# Patient Record
Sex: Female | Born: 1975 | Race: Black or African American | Hispanic: No | Marital: Single | Smoking: Current every day smoker
Health system: Southern US, Community
[De-identification: ages and names within clinical notes are randomized; demographics above are authoritative.]

## PROBLEM LIST (undated history)

## (undated) ENCOUNTER — Inpatient Hospital Stay (HOSPITAL_COMMUNITY): Payer: Self-pay

## (undated) DIAGNOSIS — F329 Major depressive disorder, single episode, unspecified: Secondary | ICD-10-CM

## (undated) DIAGNOSIS — Z789 Other specified health status: Secondary | ICD-10-CM

## (undated) DIAGNOSIS — F419 Anxiety disorder, unspecified: Secondary | ICD-10-CM

## (undated) DIAGNOSIS — F32A Depression, unspecified: Secondary | ICD-10-CM

## (undated) HISTORY — PX: OVARIAN CYST SURGERY: SHX726

---
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
## 1997-05-18 ENCOUNTER — Ambulatory Visit (HOSPITAL_COMMUNITY): Admission: RE | Admit: 1997-05-18 | Discharge: 1997-05-18 | Payer: Self-pay | Admitting: Obstetrics

## 1997-07-06 ENCOUNTER — Inpatient Hospital Stay (HOSPITAL_COMMUNITY): Admission: AD | Admit: 1997-07-06 | Discharge: 1997-07-06 | Payer: Self-pay | Admitting: Obstetrics

## 1997-07-07 ENCOUNTER — Encounter: Admission: RE | Admit: 1997-07-07 | Discharge: 1997-10-05 | Payer: Self-pay | Admitting: Obstetrics

## 1997-08-10 ENCOUNTER — Ambulatory Visit (HOSPITAL_COMMUNITY): Admission: RE | Admit: 1997-08-10 | Discharge: 1997-08-10 | Payer: Self-pay | Admitting: Obstetrics & Gynecology

## 1997-09-13 ENCOUNTER — Inpatient Hospital Stay (HOSPITAL_COMMUNITY): Admission: AD | Admit: 1997-09-13 | Discharge: 1997-09-13 | Payer: Self-pay | Admitting: *Deleted

## 1997-09-14 ENCOUNTER — Inpatient Hospital Stay (HOSPITAL_COMMUNITY): Admission: AD | Admit: 1997-09-14 | Discharge: 1997-09-14 | Payer: Self-pay | Admitting: Obstetrics

## 1997-09-19 ENCOUNTER — Ambulatory Visit (HOSPITAL_COMMUNITY): Admission: RE | Admit: 1997-09-19 | Discharge: 1997-09-19 | Payer: Self-pay | Admitting: Obstetrics

## 1997-10-11 ENCOUNTER — Inpatient Hospital Stay (HOSPITAL_COMMUNITY): Admission: AD | Admit: 1997-10-11 | Discharge: 1997-10-11 | Payer: Self-pay | Admitting: *Deleted

## 1997-10-20 ENCOUNTER — Encounter: Admission: RE | Admit: 1997-10-20 | Discharge: 1998-01-18 | Payer: Self-pay | Admitting: Obstetrics

## 1997-11-17 ENCOUNTER — Inpatient Hospital Stay (HOSPITAL_COMMUNITY): Admission: AD | Admit: 1997-11-17 | Discharge: 1997-11-18 | Payer: Self-pay | Admitting: Obstetrics

## 1998-02-27 ENCOUNTER — Encounter: Payer: Self-pay | Admitting: Emergency Medicine

## 1998-02-27 ENCOUNTER — Emergency Department (HOSPITAL_COMMUNITY): Admission: EM | Admit: 1998-02-27 | Discharge: 1998-02-27 | Payer: Self-pay | Admitting: *Deleted

## 1998-10-22 ENCOUNTER — Emergency Department (HOSPITAL_COMMUNITY): Admission: EM | Admit: 1998-10-22 | Discharge: 1998-10-22 | Payer: Self-pay | Admitting: Emergency Medicine

## 1999-04-17 ENCOUNTER — Inpatient Hospital Stay (HOSPITAL_COMMUNITY): Admission: AD | Admit: 1999-04-17 | Discharge: 1999-04-17 | Payer: Self-pay | Admitting: *Deleted

## 1999-04-26 ENCOUNTER — Encounter: Admission: RE | Admit: 1999-04-26 | Discharge: 1999-04-26 | Payer: Self-pay | Admitting: Obstetrics

## 1999-05-01 ENCOUNTER — Ambulatory Visit (HOSPITAL_COMMUNITY): Admission: RE | Admit: 1999-05-01 | Discharge: 1999-05-01 | Payer: Self-pay | Admitting: Obstetrics

## 1999-05-17 ENCOUNTER — Encounter: Admission: RE | Admit: 1999-05-17 | Discharge: 1999-05-17 | Payer: Self-pay | Admitting: Obstetrics

## 1999-07-11 ENCOUNTER — Inpatient Hospital Stay (HOSPITAL_COMMUNITY): Admission: AD | Admit: 1999-07-11 | Discharge: 1999-07-11 | Payer: Self-pay | Admitting: Obstetrics

## 1999-07-30 ENCOUNTER — Inpatient Hospital Stay (HOSPITAL_COMMUNITY): Admission: AD | Admit: 1999-07-30 | Discharge: 1999-08-01 | Payer: Self-pay | Admitting: Obstetrics

## 1999-08-03 ENCOUNTER — Emergency Department (HOSPITAL_COMMUNITY): Admission: EM | Admit: 1999-08-03 | Discharge: 1999-08-03 | Payer: Self-pay | Admitting: Emergency Medicine

## 2000-08-17 ENCOUNTER — Emergency Department (HOSPITAL_COMMUNITY): Admission: EM | Admit: 2000-08-17 | Discharge: 2000-08-17 | Payer: Self-pay | Admitting: Emergency Medicine

## 2001-03-19 ENCOUNTER — Encounter: Payer: Self-pay | Admitting: Emergency Medicine

## 2001-03-19 ENCOUNTER — Emergency Department (HOSPITAL_COMMUNITY): Admission: EM | Admit: 2001-03-19 | Discharge: 2001-03-19 | Payer: Self-pay

## 2001-06-11 ENCOUNTER — Other Ambulatory Visit: Admission: RE | Admit: 2001-06-11 | Discharge: 2001-06-11 | Payer: Self-pay | Admitting: *Deleted

## 2001-07-03 ENCOUNTER — Encounter: Payer: Self-pay | Admitting: *Deleted

## 2001-07-03 ENCOUNTER — Ambulatory Visit (HOSPITAL_COMMUNITY): Admission: RE | Admit: 2001-07-03 | Discharge: 2001-07-03 | Payer: Self-pay | Admitting: *Deleted

## 2001-08-28 ENCOUNTER — Inpatient Hospital Stay (HOSPITAL_COMMUNITY): Admission: AD | Admit: 2001-08-28 | Discharge: 2001-08-29 | Payer: Self-pay | Admitting: *Deleted

## 2001-11-01 ENCOUNTER — Emergency Department (HOSPITAL_COMMUNITY): Admission: EM | Admit: 2001-11-01 | Discharge: 2001-11-01 | Payer: Self-pay | Admitting: *Deleted

## 2001-11-09 ENCOUNTER — Emergency Department (HOSPITAL_COMMUNITY): Admission: EM | Admit: 2001-11-09 | Discharge: 2001-11-09 | Payer: Self-pay | Admitting: Emergency Medicine

## 2001-11-25 ENCOUNTER — Emergency Department (HOSPITAL_COMMUNITY): Admission: EM | Admit: 2001-11-25 | Discharge: 2001-11-26 | Payer: Self-pay | Admitting: Emergency Medicine

## 2002-11-27 ENCOUNTER — Encounter: Payer: Self-pay | Admitting: Obstetrics & Gynecology

## 2002-11-27 ENCOUNTER — Inpatient Hospital Stay (HOSPITAL_COMMUNITY): Admission: AD | Admit: 2002-11-27 | Discharge: 2002-11-27 | Payer: Self-pay | Admitting: Obstetrics & Gynecology

## 2003-03-22 ENCOUNTER — Encounter: Admission: RE | Admit: 2003-03-22 | Discharge: 2003-03-22 | Payer: Self-pay | Admitting: *Deleted

## 2003-03-25 ENCOUNTER — Inpatient Hospital Stay (HOSPITAL_COMMUNITY): Admission: AD | Admit: 2003-03-25 | Discharge: 2003-03-27 | Payer: Self-pay | Admitting: *Deleted

## 2003-06-11 ENCOUNTER — Emergency Department (HOSPITAL_COMMUNITY): Admission: EM | Admit: 2003-06-11 | Discharge: 2003-06-11 | Payer: Self-pay | Admitting: Emergency Medicine

## 2003-06-14 ENCOUNTER — Emergency Department (HOSPITAL_COMMUNITY): Admission: AD | Admit: 2003-06-14 | Discharge: 2003-06-14 | Payer: Self-pay | Admitting: Family Medicine

## 2004-01-10 ENCOUNTER — Emergency Department (HOSPITAL_COMMUNITY): Admission: EM | Admit: 2004-01-10 | Discharge: 2004-01-10 | Payer: Self-pay | Admitting: Emergency Medicine

## 2004-01-18 ENCOUNTER — Emergency Department (HOSPITAL_COMMUNITY): Admission: EM | Admit: 2004-01-18 | Discharge: 2004-01-18 | Payer: Self-pay | Admitting: Emergency Medicine

## 2004-08-06 ENCOUNTER — Emergency Department (HOSPITAL_COMMUNITY): Admission: EM | Admit: 2004-08-06 | Discharge: 2004-08-06 | Payer: Self-pay | Admitting: Emergency Medicine

## 2004-08-17 ENCOUNTER — Emergency Department (HOSPITAL_COMMUNITY): Admission: EM | Admit: 2004-08-17 | Discharge: 2004-08-17 | Payer: Self-pay | Admitting: Emergency Medicine

## 2004-10-06 ENCOUNTER — Emergency Department (HOSPITAL_COMMUNITY): Admission: EM | Admit: 2004-10-06 | Discharge: 2004-10-06 | Payer: Self-pay | Admitting: Emergency Medicine

## 2005-01-17 ENCOUNTER — Inpatient Hospital Stay (HOSPITAL_COMMUNITY): Admission: AD | Admit: 2005-01-17 | Discharge: 2005-01-17 | Payer: Self-pay | Admitting: *Deleted

## 2005-02-18 ENCOUNTER — Inpatient Hospital Stay (HOSPITAL_COMMUNITY): Admission: AD | Admit: 2005-02-18 | Discharge: 2005-02-20 | Payer: Self-pay | Admitting: Obstetrics

## 2005-02-18 ENCOUNTER — Encounter (INDEPENDENT_AMBULATORY_CARE_PROVIDER_SITE_OTHER): Payer: Self-pay | Admitting: *Deleted

## 2005-02-19 ENCOUNTER — Encounter (INDEPENDENT_AMBULATORY_CARE_PROVIDER_SITE_OTHER): Payer: Self-pay | Admitting: *Deleted

## 2005-08-14 ENCOUNTER — Emergency Department (HOSPITAL_COMMUNITY): Admission: EM | Admit: 2005-08-14 | Discharge: 2005-08-14 | Payer: Self-pay | Admitting: Family Medicine

## 2005-11-18 ENCOUNTER — Inpatient Hospital Stay (HOSPITAL_COMMUNITY): Admission: AD | Admit: 2005-11-18 | Discharge: 2005-11-19 | Payer: Self-pay | Admitting: Gynecology

## 2005-11-18 ENCOUNTER — Encounter: Payer: Self-pay | Admitting: Emergency Medicine

## 2005-11-18 ENCOUNTER — Encounter (INDEPENDENT_AMBULATORY_CARE_PROVIDER_SITE_OTHER): Payer: Self-pay | Admitting: *Deleted

## 2005-11-21 ENCOUNTER — Inpatient Hospital Stay (HOSPITAL_COMMUNITY): Admission: AD | Admit: 2005-11-21 | Discharge: 2005-11-21 | Payer: Self-pay | Admitting: Obstetrics

## 2006-07-13 ENCOUNTER — Inpatient Hospital Stay (HOSPITAL_COMMUNITY): Admission: AD | Admit: 2006-07-13 | Discharge: 2006-07-13 | Payer: Self-pay | Admitting: Obstetrics

## 2007-03-02 ENCOUNTER — Emergency Department (HOSPITAL_COMMUNITY): Admission: EM | Admit: 2007-03-02 | Discharge: 2007-03-02 | Payer: Self-pay | Admitting: Emergency Medicine

## 2007-05-19 ENCOUNTER — Inpatient Hospital Stay (HOSPITAL_COMMUNITY): Admission: AD | Admit: 2007-05-19 | Discharge: 2007-05-19 | Payer: Self-pay | Admitting: Obstetrics & Gynecology

## 2007-05-20 ENCOUNTER — Inpatient Hospital Stay (HOSPITAL_COMMUNITY): Admission: AD | Admit: 2007-05-20 | Discharge: 2007-05-20 | Payer: Self-pay | Admitting: Gynecology

## 2007-05-23 ENCOUNTER — Inpatient Hospital Stay (HOSPITAL_COMMUNITY): Admission: AD | Admit: 2007-05-23 | Discharge: 2007-05-23 | Payer: Self-pay | Admitting: Obstetrics and Gynecology

## 2007-05-26 ENCOUNTER — Inpatient Hospital Stay (HOSPITAL_COMMUNITY): Admission: AD | Admit: 2007-05-26 | Discharge: 2007-05-26 | Payer: Self-pay | Admitting: Obstetrics

## 2007-05-27 ENCOUNTER — Inpatient Hospital Stay (HOSPITAL_COMMUNITY): Admission: AD | Admit: 2007-05-27 | Discharge: 2007-05-27 | Payer: Self-pay | Admitting: Obstetrics

## 2007-05-30 ENCOUNTER — Inpatient Hospital Stay (HOSPITAL_COMMUNITY): Admission: AD | Admit: 2007-05-30 | Discharge: 2007-05-30 | Payer: Self-pay | Admitting: Obstetrics

## 2007-06-03 ENCOUNTER — Inpatient Hospital Stay (HOSPITAL_COMMUNITY): Admission: AD | Admit: 2007-06-03 | Discharge: 2007-06-03 | Payer: Self-pay | Admitting: Obstetrics

## 2007-09-29 ENCOUNTER — Emergency Department (HOSPITAL_COMMUNITY): Admission: EM | Admit: 2007-09-29 | Discharge: 2007-09-29 | Payer: Self-pay | Admitting: Emergency Medicine

## 2007-10-15 ENCOUNTER — Emergency Department (HOSPITAL_COMMUNITY): Admission: EM | Admit: 2007-10-15 | Discharge: 2007-10-15 | Payer: Self-pay | Admitting: Emergency Medicine

## 2008-01-29 ENCOUNTER — Emergency Department (HOSPITAL_COMMUNITY): Admission: EM | Admit: 2008-01-29 | Discharge: 2008-01-29 | Payer: Self-pay | Admitting: Emergency Medicine

## 2008-03-15 ENCOUNTER — Emergency Department (HOSPITAL_COMMUNITY): Admission: EM | Admit: 2008-03-15 | Discharge: 2008-03-15 | Payer: Self-pay | Admitting: Emergency Medicine

## 2008-11-30 ENCOUNTER — Emergency Department (HOSPITAL_COMMUNITY): Admission: EM | Admit: 2008-11-30 | Discharge: 2008-11-30 | Payer: Self-pay | Admitting: Emergency Medicine

## 2009-05-19 ENCOUNTER — Emergency Department (HOSPITAL_COMMUNITY): Admission: EM | Admit: 2009-05-19 | Discharge: 2009-05-19 | Payer: Self-pay | Admitting: Emergency Medicine

## 2009-12-05 ENCOUNTER — Inpatient Hospital Stay (HOSPITAL_COMMUNITY): Admission: AD | Admit: 2009-12-05 | Discharge: 2009-12-06 | Payer: Self-pay | Admitting: Obstetrics & Gynecology

## 2010-06-22 LAB — URINALYSIS, ROUTINE W REFLEX MICROSCOPIC
Hgb urine dipstick: NEGATIVE
Nitrite: NEGATIVE
Specific Gravity, Urine: 1.025 (ref 1.005–1.030)
Urobilinogen, UA: 1 mg/dL (ref 0.0–1.0)

## 2010-06-22 LAB — URINE MICROSCOPIC-ADD ON

## 2010-06-22 LAB — POCT PREGNANCY, URINE: Preg Test, Ur: NEGATIVE

## 2010-08-20 ENCOUNTER — Emergency Department (HOSPITAL_COMMUNITY)
Admission: EM | Admit: 2010-08-20 | Discharge: 2010-08-20 | Disposition: A | Discharge status: Home / Self Care | Payer: Self-pay | Attending: Emergency Medicine | Admitting: Emergency Medicine

## 2010-08-20 DIAGNOSIS — K029 Dental caries, unspecified: Secondary | ICD-10-CM | POA: Insufficient documentation

## 2010-08-20 DIAGNOSIS — K089 Disorder of teeth and supporting structures, unspecified: Secondary | ICD-10-CM | POA: Insufficient documentation

## 2010-08-24 NOTE — Op Note (Signed)
NAME:  Vanessa Walsh, REINECKE                 ACCOUNT NO.:  [id number]   MEDICAL RECORD NO.:  [id number]          PATIENT TYPE:  INP   LOCATION:                                [facility]   PHYSICIAN:  Kathreen Cosier, M.D.DATE OF BIRTH:  [birth year]   DATE OF PROCEDURE:  11/18/2005  DATE OF DISCHARGE:                                 OPERATIVE REPORT   PREOPERATIVE DIAGNOSIS:  Ruptured ectopic pregnancy.   POSTOPERATIVE DIAGNOSES:  1. Ruptured hemorrhagic corpus luteum cyst.  2. Possible tubal abortion.   SURGEON:  Kathreen Cosier, M.D.   PROCEDURE:  1. Wedge resection of the left ovary.  2. Partial salpingectomy.   DESCRIPTION OF PROCEDURE:  Under general anesthesia with the patient in the  supine position the abdomen was prepped and draped and bladder emptied with  the Foley catheter.  A transverse suprapubic incision 2 inches long was made  and carried down to the fascia.  The fascia __________ incision and recti  muscles were retracted laterally.  The peritoneum was incised  longitudinally.  About 200 mL of free blood and clots in her peritoneal  cavity.  Uterus normal, right ovary normal.  Right tube appeared normal  except for where she had a tubal ligation.  Left tube--she had a tubal  ligation and the ampullary portion appeared hemorrhagic.  She was leaking  blood from her left ovary.  So, using the scalpel, a wedge resection was  done on the left and hemostasis was achieved with interrupted sutures of #0  Prolene.  Kelly's were placed across the ampullary portion of the left tube  and the portion of the tube was removed.   Hemostasis was achieved with an interrupted suture of #0 chromic. Hemostasis  was satisfactory and lap and sponge counts were correct.  Peritoneal cavity  irrigated.  The abdomen was closed in layers, peritoneum with continuous  suture of #0 chromic, fascia with continuous suture of #0 Dexon and the skin  closed with subcuticular stitch of 3-0  Monocryl.   POSTOPERATIVE DIAGNOSES:  1. Ruptured hemorrhagic left post luteal cyst.  2. Possible tubal abortion.           ______________________________  Kathreen Cosier, M.D.     BAM/MEDQ  D:  11/18/2005  T:  11/18/2005  Job:  [id number]

## 2010-08-24 NOTE — Discharge Summary (Signed)
NAME:  Vanessa Walsh, Vanessa Walsh                 ACCOUNT NO.:  [id number]   MEDICAL RECORD NO.:  [id number]          PATIENT TYPE:  INP   LOCATION:  9318                          [facility]  [facility]   PHYSICIAN:  Kathreen Cosier, M.D.DATE OF BIRTH:  [birth year]   DATE OF ADMISSION:  11/18/2005  DATE OF DISCHARGE:  11/19/2005                                 DISCHARGE SUMMARY   Patient is a 35 year old gravida 10, para 9, 0-0-9, with a postpartum tubal  ligation.  She was admitted with cramping, positive pregnancy test.   Ultrasound showed a complex left adnexal mass with free fluid in the  abdomen.   Patient was taken to the operating room with a diagnosis of erupted ectopic  pregnancy.  It was found that she had an erupted corpus luteum cyst,  actively bleeding, a tubal abortion.  She underwent resection on the left  ovary and a left salpingectomy performed.  The right ovary was normal.  The  right tube was status post tubal ligation.  The patient was discharged on  August 14 on Tylox for pain to see me in two weeks.   DISCHARGE DIAGNOSIS:  Status post erupted hemorrhagic corpus luteum cyst and  tubal abortion.           ______________________________  Kathreen Cosier, M.D.     BAM/MEDQ  D:  12/11/2005  T:  12/11/2005  Job:  [id number]

## 2010-08-24 NOTE — Discharge Summary (Signed)
NAME:  Vanessa Walsh, Vanessa Walsh                 ACCOUNT NO.:  [id number]   MEDICAL RECORD NO.:  [id number]          PATIENT TYPE:  INP   LOCATION:  9111                          [facility]  [facility]   PHYSICIAN:  Kathreen Cosier, M.D.DATE OF BIRTH:  12/09/1975   DATE OF ADMISSION:  02/18/2005  DATE OF DISCHARGE:  02/20/2005                                 DISCHARGE SUMMARY   Patient is a 35 year old gravida 30, para 6-2-0-8, [facility] March 03, 2005  admitted in labor.  Had a normal vaginal delivery at term.  Patient desired  sterilization.  On admission her hemoglobin is 9.8, white count 24,  platelets 258, RPR negative, HIV negative.  Post delivery hemoglobin 8.3,  white count 15.8.  On November 14 the patient underwent postpartum tubal  ligation.  Postoperative she did fine.  Was discharged on her second  postpartum day, ambulatory, on a regular diet.  Hemoglobin 8.3.  Patient was  discharged on Tylenol No.3 for pain.   DISCHARGE DIAGNOSES:  Status post normal vaginal delivery at term,  postpartum tubal ligation.           ______________________________  Kathreen Cosier, M.D.     BAM/MEDQ  D:  03/13/2005  T:  03/13/2005  Job:  [id number]

## 2010-08-24 NOTE — Op Note (Signed)
NAME:  Vanessa Walsh, Vanessa Walsh                 ACCOUNT NO.:  [id number]   MEDICAL RECORD NO.:  [id number]          PATIENT TYPE:  INP   LOCATION:  9111                          [facility]  [facility]   PHYSICIAN:  Kathreen Cosier, M.D.DATE OF BIRTH:  05-Jan-1976   DATE OF PROCEDURE:  02/19/2005  DATE OF DISCHARGE:                                 OPERATIVE REPORT   PROCEDURE:  Postpartum tubal ligation.   ANESTHESIA:  Epidural.   DESCRIPTION OF PROCEDURE:  With the patient in the supine position, the  abdomen was prepped and draped.  The bladder was emptied with a straight  catheter.  Midline subumbilical incision 1 inch long was made and carried  down to the fascia.  The fascia was cleaned and grasped with two Kochers.  The fascia and the peritoneum were opened with the Mayo scissors.  The left  tube was grasped in the midportion with the Babcock clamp and the tube  traced to the fimbria.  0 plain suture was placed in the mesosalpinx below  the portion of the tube within the clamp.  This was tied.  Approximately 1  inch of tube transected.  Hemostasis was satisfactory.  The procedure was  repeated on the other side in the exact fashion.  Needle, sponge, and  instrument counts correct.  Abdomen closed in layers.  The peritoneum and  fascia with continuous suture of 0 Dexon.  Skin closed with subcuticular  stitch of 4-0 Monocryl.  Estimated blood loss minimal.  The patient  tolerated the procedure well and was taken to the recovery room in good  condition.           ______________________________  Kathreen Cosier, M.D.     BAM/MEDQ  D:  02/19/2005  T:  02/19/2005  Job:  [id number]

## 2010-11-28 ENCOUNTER — Emergency Department (HOSPITAL_COMMUNITY): Payer: Medicaid Other

## 2010-11-28 ENCOUNTER — Emergency Department (HOSPITAL_COMMUNITY)
Admission: EM | Admit: 2010-11-28 | Discharge: 2010-11-28 | Disposition: A | Discharge status: Home / Self Care | Payer: Medicaid Other | Attending: Emergency Medicine | Admitting: Emergency Medicine

## 2010-11-28 DIAGNOSIS — M545 Low back pain, unspecified: Secondary | ICD-10-CM | POA: Insufficient documentation

## 2010-11-28 DIAGNOSIS — M25559 Pain in unspecified hip: Secondary | ICD-10-CM | POA: Insufficient documentation

## 2010-12-28 LAB — CREATININE, SERUM: GFR calc non Af Amer: >60

## 2010-12-28 LAB — DIFFERENTIAL
Basophils Absolute: 0
Basophils Relative: 0
Eosinophils Relative: 3
Monocytes Absolute: 0.9

## 2010-12-28 LAB — CBC
HCT: 35.9 — ABNORMAL LOW
Hemoglobin: 12.4
MCHC: 34.5
RDW: 13.9

## 2010-12-31 LAB — BUN: BUN: 6

## 2010-12-31 LAB — DIFFERENTIAL
Eosinophils Absolute: 0.2
Eosinophils Relative: 4
Lymphs Abs: 1.9
Monocytes Absolute: 0.3

## 2010-12-31 LAB — CBC
HCT: 35.7 — ABNORMAL LOW
Platelets: 211
RDW: 13.2

## 2010-12-31 LAB — HCG, QUANTITATIVE, PREGNANCY
hCG, Beta Chain, Quant, S: 305 — ABNORMAL HIGH
hCG, Beta Chain, Quant, S: 867 — ABNORMAL HIGH

## 2010-12-31 LAB — AST: AST: 13

## 2010-12-31 LAB — CREATININE, SERUM
Creatinine, Ser: 0.74
GFR calc non Af Amer: >60

## 2011-07-05 ENCOUNTER — Emergency Department (HOSPITAL_COMMUNITY)
Admission: EM | Admit: 2011-07-05 | Discharge: 2011-07-05 | Disposition: A | Discharge status: Home / Self Care | Payer: Medicaid Other | Attending: Emergency Medicine | Admitting: Emergency Medicine

## 2011-07-05 ENCOUNTER — Emergency Department (HOSPITAL_COMMUNITY): Payer: Medicaid Other

## 2011-07-05 ENCOUNTER — Encounter (HOSPITAL_COMMUNITY): Payer: Self-pay | Admitting: Emergency Medicine

## 2011-07-05 DIAGNOSIS — M79609 Pain in unspecified limb: Secondary | ICD-10-CM | POA: Insufficient documentation

## 2011-07-05 DIAGNOSIS — M7710 Lateral epicondylitis, unspecified elbow: Secondary | ICD-10-CM | POA: Insufficient documentation

## 2011-07-05 DIAGNOSIS — R29898 Other symptoms and signs involving the musculoskeletal system: Secondary | ICD-10-CM | POA: Insufficient documentation

## 2011-07-05 DIAGNOSIS — M25529 Pain in unspecified elbow: Secondary | ICD-10-CM | POA: Insufficient documentation

## 2011-07-05 LAB — DIFFERENTIAL
Basophils Absolute: 0 10*3/uL (ref 0.0–0.1)
Basophils Relative: 0 % (ref 0–1)
Lymphocytes Relative: 35 % (ref 12–46)
Monocytes Absolute: 0.7 10*3/uL (ref 0.1–1.0)
Neutro Abs: 5.4 10*3/uL (ref 1.7–7.7)
Neutrophils Relative %: 56 % (ref 43–77)

## 2011-07-05 LAB — SEDIMENTATION RATE: Sed Rate: 30 mm/hr — ABNORMAL HIGH (ref 0–22)

## 2011-07-05 LAB — D-DIMER, QUANTITATIVE: D-Dimer, Quant: 0.33 ug/mL-FEU (ref 0.00–0.48)

## 2011-07-05 LAB — CBC
HCT: 36.8 % (ref 36.0–46.0)
Platelets: 263 10*3/uL (ref 150–400)
RDW: 13.8 % (ref 11.5–15.5)
WBC: 9.5 10*3/uL (ref 4.0–10.5)

## 2011-07-05 MED ORDER — KETOROLAC TROMETHAMINE 30 MG/ML IJ SOLN
30.0000 mg | Freq: Once | INTRAMUSCULAR | Status: AC
  Administered 2011-07-05: 30 mg via INTRAMUSCULAR
  Filled 2011-07-05: qty 1

## 2011-07-05 MED ORDER — OXYCODONE-ACETAMINOPHEN 5-325 MG PO TABS
2.0000 | ORAL_TABLET | Freq: Once | ORAL | Status: DC
  Filled 2011-07-05: qty 2

## 2011-07-05 MED ORDER — NAPROXEN 500 MG PO TABS: 500.0000 mg | ORAL_TABLET | Freq: Two times a day (BID) | ORAL | Status: DC

## 2011-07-05 MED ORDER — HYDROCODONE-ACETAMINOPHEN 5-325 MG PO TABS: 1.0000 | ORAL_TABLET | ORAL | Status: AC | PRN

## 2011-07-05 NOTE — ED Provider Notes (Signed)
History     CSN: [id number]  Arrival date & time 07/05/11  0029   First MD Initiated Contact with Patient 07/05/11 0215      Chief Complaint  Patient presents with  . Arm Pain     HPI  History provided by the patient. Patient is a 36 year old female with no significant medical history who presents with complaints of right elbow and arm pain for the past several weeks. Pain is worse with certain movements and position. Pain is described as severe. Patient has not taken anything to treat her symptoms. She denies any other aggravating or alleviating factors. Pain is not associated with any swelling, skin changes, fever, chills or numbness. Patient does state that her arm feels weak since she has been unable to use it. Patient denies any history of trauma or injury to the arm. 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 She denies having similar symptoms previously.   History reviewed. No pertinent past medical history.  History reviewed. No pertinent past surgical history.  No family history on file.  History  Substance Use Topics  . Smoking status: Never Smoker   . Smokeless tobacco: Not on file  . Alcohol Use: No    OB History    Grav Para Term Preterm Abortions TAB SAB Ect Mult Living                  Review of Systems  Constitutional: Negative for fever and chills.  HENT: Negative for neck pain.   Musculoskeletal: Negative for joint swelling.  Neurological: Positive for weakness. Negative for numbness.    Allergies  Review of patient's allergies indicates no known allergies.  Home Medications  No current outpatient prescriptions on file.  BP 128/66  Pulse 83  Temp(Src) 98.1 F (36.7 C) (Oral)  Resp 18  SpO2 100%  LMP 06/04/2011  Physical Exam  Nursing note and vitals reviewed. Constitutional: She is oriented to person, place, and time. She appears well-developed and well-nourished. No distress.  HENT:    Head: Normocephalic and atraumatic.  Neck: Normal range of motion. Neck supple.       No cervical midline tenderness  Cardiovascular: Normal rate and regular rhythm.   Pulmonary/Chest: Effort normal and breath sounds normal.  Musculoskeletal: Normal range of motion. She exhibits tenderness. She exhibits no edema.       Tenderness over medial and lateral right elbow but greatest over lateral elbow and epicondyle. Tenderness extends into the extensor muscles of forearm. No deformity or swelling noted. Will range of motion of elbow, shoulder and right hand. Skin normal without erythema. No lesions to skin.  Neurological: She is alert and oriented to person, place, and time.  Skin: Skin is warm and dry. No rash noted.  Psychiatric: She has a normal mood and affect. Her behavior is normal.    ED Course  Procedures   Results for orders placed during the hospital encounter of 07/05/11  SEDIMENTATION RATE      Component Value Range   Sed Rate 30 (*) 0 - 22 (mm/hr)  CBC      Component Value Range   WBC 9.5  4.0 - 10.5 (K/uL)   RBC 3.78 (*) 3.87 - 5.11 (MIL/uL)   Hemoglobin 12.1  12.0 - 15.0 (g/dL)   HCT 40.9  81.1 - 91.4 (%)   MCV 97.4  78.0 - 100.0 (fL)   MCH 32.0  26.0 - 34.0 (pg)  MCHC 32.9  30.0 - 36.0 (g/dL)   RDW 82.9  56.2 - 13.0 (%)   Platelets 263  150 - 400 (K/uL)  DIFFERENTIAL      Component Value Range   Neutrophils Relative 56  43 - 77 (%)   Neutro Abs 5.4  1.7 - 7.7 (K/uL)   Lymphocytes Relative 35  12 - 46 (%)   Lymphs Abs 3.3  0.7 - 4.0 (K/uL)   Monocytes Relative 7  3 - 12 (%)   Monocytes Absolute 0.7  0.1 - 1.0 (K/uL)   Eosinophils Relative 2  0 - 5 (%)   Eosinophils Absolute 0.2  0.0 - 0.7 (K/uL)   Basophils Relative 0  0 - 1 (%)   Basophils Absolute 0.0  0.0 - 0.1 (K/uL)  D-DIMER, QUANTITATIVE      Component Value Range   D-Dimer, Quant 0.33  0.00 - 0.48 (ug/mL-FEU)      Dg Elbow Complete Right  07/05/2011  *RADIOLOGY REPORT*  Clinical Data: Right elbow  pain and stiffness.  RIGHT ELBOW - COMPLETE 3+ VIEW  Comparison: None.  Findings: There is no evidence of fracture or dislocation.  The visualized joint spaces are preserved.  No significant joint effusion is identified.  The soft tissues are unremarkable in appearance.  IMPRESSION: No evidence of fracture or dislocation.  Original Report Authenticated By: Tonia Ghent, M.D.     1. Epicondylitis, lateral       MDM  Patient seen and evaluated. Patient in no acute distress.        Angus Seller, PA 07/05/11 0600

## 2011-07-05 NOTE — ED Provider Notes (Signed)
Medical screening examination/treatment/procedure(s) were performed by non-physician practitioner and as supervising physician I was immediately available for consultation/collaboration.  Jasmine Awe, MD 07/05/11 [phone]

## 2011-07-05 NOTE — Discharge Instructions (Signed)
Your x-rays and lab tests have not shown any concerning or emergent cause a severe symptoms today. At this time your providers feel your symptoms are caused from overuse of your arm and elbow causing a tendinitis. You should use rest, ice, compression and elevation to rest your elbow. Please followup with a primary care provider or an orthopedic specialist for continued evaluation and treatment of your symptoms.   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 Although the condition is often called tennis or golfer's elbow, it is caused by any repetitive action performed by your elbow. HOME CARE INSTRUCTIONS  If the condition has been short lived, rest may be the only treatment required. Using your opposite hand or arm to perform the task may help. Even changing your grip may help rest the extremity. These may even prevent the condition from recurring.   Longer standing problems, however, will often be relieved faster by:   Using anti-inflammatory agents.   Applying ice packs for 30 minutes at the end of the working day, at bed time, or when activities are finished.   Your caregiver may also have you wear a splint or sling. This will allow the inflamed tendon to heal.  At times, steroid injections aided with a local anesthetic will be required along with splinting for 1 to 2 weeks. Two to three steroid injections will often solve the problem. In some long standing cases, the inflamed tendon does not respond to conservative (non-surgical) therapy. Then surgery may be required to repair it. MAKE SURE YOU:   Understand these instructions.   Will watch your condition.   Will get help right away if you are not doing well or get worse.  Document Released: 03/25/2005 Document Revised: 03/14/2011 Document Reviewed:  11/11/2007 [facility] Patient Information 2012 [place], [place].    Repetitive Strain Injuries Repetitive strain injuries (RSIs) are now the single largest cause of job related (occupational) health problems in the [place] RSIs can occur in any job that requires repetitive, forceful, or awkward motions. Repetitive strain injuries are a group of health problems that result from several causes. These include overuse or misuse of muscles, cord-like structures attaching muscle to bone (tendons), and nerves. CAUSES  Job-related RSIs are caused by any combination of the following factors:  Fast pace (working quickly).   Repetitive tasks (making the same motion over and over).   Awkward or fixed posture (working in an awkward position or holding the same position for a long time).   Forceful movements (lifting, pulling, or pushing).   Vibration (caused by power tools).   Working in cold temperatures.   Job stress (such as monitoring).   Inadequate rest breaks (overuse).  RSIs develop over time and are also called cumulative trauma disorders (CTDs). Repetitive strain injuries have other names, too. These include:  Occupational overuse syndrome.   Repetitive motion disorders.   Repetitive damage caused by an accident (trauma) disorders (RTDs).  RSIs can affect almost any part of the body. But they often occur in the upper body. The most commonly affected body parts are:   Fingers.   Elbows.   Hands.   Shoulders.   Wrists.   Back.   Arms.   Neck.  RISK INCREASES WITH: You may be at risk for developing an RSI, if you:  Have poor posture.   Have poor  technique.   Use a computer more than two to four hours a day.   Have a job that requires constant computer use.   Do not take frequent breaks.   Are loose-jointed.   Do not exercise regularly.   Work in a high pressure environment.   Have arthritis, diabetes, or another serious medical condition.   Keep your  fingernails long.   Have an unhealthy, stressful, or inactive lifestyle.   Weigh more than you should.   Do not sleep well.   Are unwilling to ask for a better work setting, chair, desk, etc.   Are macho, and don't believe you are at risk when you really are, or think that you can just "take it."  SYMPTOMS  These problems (symptoms) may appear in any order, and at any stage in the development of the injury. Symptoms may occur at any time: during work, right after work, or many hours or days after work. Many people first experience symptoms when they are not working. For example, an injured worker may have no pain at work, but may wake up at night with a painful shoulder or elbow. The most common symptoms are:  Burning, shooting, or aching pain, especially in the fingers, palms, wrists, forearms, or shoulders.   Tenderness.   Swelling.   Tingling, numbness, or loss of feeling.   Loss of joint mobility (difficulty moving the wrist or elbow, for example).   Weakness, heaviness, or loss of coordination in the hand (for example, difficulty opening a jar top).   Crackling.   Muscle spasms.   Decreased coordination, or clumsiness (for example, dropping things often).   Avoiding the use of one hand or arm, because it is painful, and preferring the other.   Difficulty doing simple things like handling keys, chopping food, putting on jewelry, writing, or brushing teeth.  Any jobs that require strain and repetition pose a risk of RSI. There are many different repetitive strain injuries, since many different parts of the body can be affected. RSI symptoms can be mild. But they can become so intense that it becomes difficult to perform everyday tasks. These tasks include opening a jar or fastening a button. In general, the more intense and frequent the symptoms, the more serious the RSI is likely to be. A serious RSI can develop only months after symptoms first appear, or it could take years  to develop.  Most RSIs are work-related. But RSIs can be caused by activities outside of work, such as sports and hobbies. Older people are more vulnerable than younger ones to RSIs. This is because the body's ability to repair the effects of wear and tear decreases with age.  If you think your repetitive strain disorder is getting worse, or you are developing one, see your caregiver for advice. Often, if treated early, the amount and length of disability can be shortened. HOME CARE INSTRUCTIONS  If your caregiver prescribed medicine to help reduce swelling, take as directed.   If you were given a splint to keep your wrist from bending (such as for carpal tunnel syndrome), use it as instructed. It is important to wear the splint at night. Use splints for as long as your caregiver recommends.   It is important to give your injury a rest by stopping the activities that are causing the problem. If your symptoms are work-related, you may need to talk to your employer about changing to a job that does not require using your injured part.  Only take over-the-counter or prescription medicines for pain, discomfort, or fever as directed by your caregiver.   Following periods of extended use, especially strenuous use, apply an ice pack wrapped in a towel to the affected (sore) area for 15 to 20 minutes. Repeat as needed, three to four times per day. This will help reduce the swelling.   Call your caregiver if you develop new, unexplained symptoms or problems that are not responding to medicines.  When non-surgical treatment does not help, surgery may be required. Non-surgical treatment could include:  Changes in the activity that caused the problem or made it worse.   Medicines to stop the swelling and soreness (anti-inflammatory medications).   Injections such as cortisone, to decrease the inflammation and soreness.  Your caregiver will help you determine which is best for you. PREVENTION An RSI can  take months, even years to develop, and it can take longer to heal. But there are ways to prevent RSIs, including:  Maintain good posture at your desk or work station with:   Feet flat on the floor.   Knees directly over feet, bent at right angles (or slightly greater), a couple inches of space away from the chair.   Pelvis rocked forward, sitting on the "sitz bones," with hips no lower than, and perhaps slightly higher than the knees.   Lower back arched in, and supported by your chair, or a towel/foam roll wedged against your back.   Upper back naturally rounded.   Shoulders and arms relaxed, and at your side.   Neck arched in, relaxed, and supported by your spine. Do not hold tension in your back or under your chin.   Head balancing gently on top of your spine.   A good quality, adjustable chair with a firm seat.   Set up your work station well, so that it reduces strain. Make sure your:   Keyboard is above your thighs. You are able to reach the keys, with your elbows at your side and bent at (slightly greater than) 90 degrees, and your forearms are about parallel to the ground.   Mouse is just to one side of your keyboard. You do not need to lean, stretch, or hunch to work it. Many people have one shoulder lower than the other. This can be caused by repetitive stretching for a mouse.   Monitor is directly in front of you (not off to the side), so that your eyes are somewhere between the top of the screen and one fifth of the way down from the top. The screen should be about 15 to 25 inches from your eyes.   Take breaks often, at least once an hour. Get up, walk around, and stretch.   Exercise regularly.   Only use your computer as much as you need to for work. Do not use it during breaks.   Do not hold your pen tightly when writing. It should be possible to pull the pen from your fingers, easily.   Let your hands float above the keyboard when you type. Move your entire arm  when moving your mouse or typing hard-to-reach keys. Always keep your wrist joint straight. This lets the big muscles in your arm, shoulder, and back do the work, instead of the smaller and weaker ones in your hand and wrist.  Document Released: 03/15/2002 Document Revised: 03/14/2011 Document Reviewed: 01/30/2009 [facility] Patient Information 2012 [place], [place].   RESOURCE GUIDE  Dental Problems  Patients with Medicaid: [facility]  [facility] [address]                                           [phone] [address] [employer] [address] Phone:  [phone]                                                  Phone:  [phone]  If unable to pay or uninsured, contact:  [facility] or [facility]. to become qualified for the adult dental clinic.  Chronic Pain Problems Contact [facility]  [phone] Patients need to be referred by their primary care doctor.  Insufficient Money for Medicine Contact [employer]:  call "211" or [facility] 269-519-4284.  No Primary Care Doctor Call [facility]  916-473-2763 Other agencies that provide inexpensive medical care    [facility]  ([phone]    [facility]  626-002-2017    [facility]  848-829-2817    [facility]  [phone]    [facility]  ([phone]    [facility]  [phone]  Psychological Services [facility]  [phone] [facility] Services  [phone] [facility] Mental Health   475-080-3447 (emergency services 623-851-0011)  Substance Abuse Resources [facility]  (340)665-5105 [facility] 941-618-5900 [facility] [place] (929)385-7139 Floyd[facility] Flock (765)742-2094 Residential & Outpatient Substance Abuse Program  801-102-5984  Abuse/Neglect [facility] Child Abuse Hotline 970-579-3939 [facility] Child Abuse Hotline 361-173-6113 (After Hours)  Emergency Shelter [facility]  Ministries 240-285-9311  Maternity Homes [facility] [place] [facility] [place] 431-109-9985 [facility] [phone]  MRSA Hotline #:   707-012-1686    [facility] Resources  [facility]     [employer]                          [facility] [address] [place]                       [address]      [address]  Blondell Reveal Phone:  [phone]                                   Phone:  [phone]                 Phone:  [phone]  [facility] Mental Health Phone:  575-215-3419  [facility] Child Abuse Hotline 603 114 3600 ([phone] (After Hours)

## 2011-07-05 NOTE — ED Notes (Signed)
PT. REPORTS BILATERAL ARM PAIN FOR 2 WEEKS WORSE WITH MOVEMENT ,  DENIES INJURY OR FALL.

## 2011-09-07 ENCOUNTER — Encounter (HOSPITAL_COMMUNITY): Payer: Self-pay

## 2011-09-07 ENCOUNTER — Emergency Department (HOSPITAL_COMMUNITY)
Admission: EM | Admit: 2011-09-07 | Discharge: 2011-09-07 | Disposition: A | Discharge status: Home / Self Care | Payer: Medicaid Other | Attending: Emergency Medicine | Admitting: Emergency Medicine

## 2011-09-07 DIAGNOSIS — G8929 Other chronic pain: Secondary | ICD-10-CM

## 2011-09-07 DIAGNOSIS — K029 Dental caries, unspecified: Secondary | ICD-10-CM | POA: Insufficient documentation

## 2011-09-07 DIAGNOSIS — F17200 Nicotine dependence, unspecified, uncomplicated: Secondary | ICD-10-CM | POA: Insufficient documentation

## 2011-09-07 DIAGNOSIS — K0889 Other specified disorders of teeth and supporting structures: Secondary | ICD-10-CM

## 2011-09-07 MED ORDER — PERCOCET 5-325 MG PO TABS: 1.0000 | ORAL_TABLET | Freq: Four times a day (QID) | ORAL | Status: AC | PRN

## 2011-09-07 MED ORDER — PENICILLIN V POTASSIUM 500 MG PO TABS: 500.0000 mg | ORAL_TABLET | Freq: Four times a day (QID) | ORAL | Status: AC

## 2011-09-07 MED ORDER — MELOXICAM 7.5 MG PO TABS: 7.5000 mg | ORAL_TABLET | Freq: Every day | ORAL | Status: DC

## 2011-09-07 NOTE — ED Notes (Signed)
Pt complains of bone pain all over and dental pain, onset over 1 year ago and has been seen for same.

## 2011-09-07 NOTE — ED Notes (Signed)
Dropped pt off and put stickers in the bin.5:55pm JG

## 2011-09-07 NOTE — Discharge Instructions (Signed)
Read the information below.  Please use the resources listed below to find a primary care provider if you are unhappy with your current one.  Call the dentist above within 48 hours of being seen in the ER to make an appointment.  You may also use the dental resources below to find a dentist for further needs.  You may return to the ER at any time for worsening condition or any new symptoms that concern you.    Dental Pain A tooth ache may be caused by cavities (tooth decay). Cavities expose the nerve of the tooth to air and hot or cold temperatures. It may come from an infection or abscess (also called a boil or furuncle) around your tooth. It is also often caused by dental caries (tooth decay). This causes the pain you are having. DIAGNOSIS  Your caregiver can diagnose this problem by exam. TREATMENT   If caused by an infection, it may be treated with medications which kill germs (antibiotics) and pain medications as prescribed by your caregiver. Take medications as directed.   Only take over-the-counter or prescription medicines for pain, discomfort, or fever as directed by your caregiver.   Whether the tooth ache today is caused by infection or dental disease, you should see your dentist as soon as possible for further care.  SEEK MEDICAL CARE IF: The exam and treatment you received today has been provided on an emergency basis only. This is not a substitute for complete medical or dental care. If your problem worsens or new problems (symptoms) appear, and you are unable to meet with your dentist, call or return to this location. SEEK IMMEDIATE MEDICAL CARE IF:   You have a fever.   You develop redness and swelling of your face, jaw, or neck.   You are unable to open your mouth.   You have severe pain uncontrolled by pain medicine.  MAKE SURE YOU:   Understand these instructions.   Will watch your condition.   Will get help right away if you are not doing well or get worse.    Document Released: 03/25/2005 Document Revised: 03/14/2011 Document Reviewed: 11/11/2007 [facility] Patient Information 2012 [place], [place].  Chronic Pain Chronic pain can be defined as pain that is lasting, off and on, and lasts for 3 to 6 months or longer. Many things cause chronic pain, which can make it difficult to make a discrete diagnosis. There are many treatment options available for chronic pain. However, finding a treatment that works well for you may require trying various approaches until a suitable one is found. CAUSES  In some types of chronic medical conditions, the pain is caused by a normal pain response within the body. A normal pain response helps the body identify illness or injury and prevent further damage from being done. In these cases, the cause of the pain may be identified and treated, even if it may not be cured completely. Examples of chronic conditions which can cause chronic pain include:  Inflammation of the joints (arthritis).   Back pain or neck pain (including bulging or herniated disks).   Migraine headaches.   Cancer.  In some other types of chronic pain syndromes, the pain is caused by an abnormal pain response within the body. An abnormal pain response is present when there is no ongoing cause (or stimulus) for the pain, or when the cause of the pain is arising from the nerves or nervous system itself. Examples of conditions which can cause chronic pain due  to an abnormal pain response include:  Fibromyalgia.   Reflex sympathetic dystrophy (RSD).   Neuropathy (when the nerves themselves are damaged, and may cause pain).  DIAGNOSIS  Your caregiver will help diagnose your condition over time. In many cases, the initial focus will be on excluding conditions that could be causing the pain. Depending on your symptoms, your caregiver may order some tests to diagnose your condition. Some of these tests include:  Blood tests.   Computerized X-ray scans (CT  scan).   Computerized magnetic scans (MRI).   X-rays.   Ultrasounds.   Nerve conduction studies.   Consultation with other physicians or specialists.  TREATMENT  There are many treatment options for people suffering from chronic pain. Finding a treatment that works well may take time.   You may be referred to a pain management specialist.   You may be put on medication to help with the pain. Unfortunately, some medications (such as opiate medications) may not be very effective in cases where chronic pain is due to abnormal pain responses. Finding the right medications can take some time.   Adjunctive therapies may be used to provide additional relief and improve a patient's quality of life. These therapies include:   Mindfulness meditation.   Acupuncture.   Biofeedback.   Cognitive-behavioral therapy.   In certain cases, surgical interventions may be attempted.  HOME CARE INSTRUCTIONS   Make sure you understand these instructions prior to discharge.   Ask any questions and share any further concerns you have with your caregiver prior to discharge.   Take all medications as directed by your caregiver.   Keep all follow-up appointments.  SEEK MEDICAL CARE IF:   Your pain gets worse.   You develop a new pain that was not present before.   You cannot tolerate any medications prescribed by your caregiver.   You develop new symptoms since your last visit with your caregiver.  SEEK IMMEDIATE MEDICAL CARE IF:   You develop muscular weakness.   You have decreased sensation or numbness.   You lose control of bowel or bladder function.   Your pain suddenly gets much worse.   You have an oral temperature above 102 F (38.9 C), not controlled by medication.   You develop shaking chills, confusion, chest pain, or shortness of breath.  Document Released: 12/15/2001 Document Revised: 03/14/2011 Document Reviewed: 03/23/2008 [facility] Patient Information 2012 [place],  [place].  If you have no primary doctor, here are some resources that may be helpful:  Medicaid-accepting [facility] Providers:   - [facility]- [address]      [phone]      Mon-Fri 9am-7pm, Sat 9am-1pm   - [facility]- [address] [place], [place]      [phone]   - [facility]- [address], [address] [place]      [phone]   [facility]- [address]      [phone]   - Renaye Rakers- [address] [place], [address]      [phone]      Only accepts [place] patients       after they have her name applied to their card   Self Pay (no insurance) in [place]:   - Sickle Cell Patients: Dr Willey Blade, [facility]      [address] [place]      [phone]   - [facility]- [phone]   - Physician Referral Service- [phone]   - [facility]- [address] [place]      [phone]   [facility] [place]- [address]   - [facility]- see information above      (Speak to [employer] if you do not have insurance)   - [facility]- [address] [place]      [phone]   - [facility]- [address]      [phone]   - [facility]- [address]      [phone]   - Dr Julio Sicks-  [address], [place]      [phone]   - [facility]- [address]      [phone]   - [facility]- [address]      [phone]      Also [address]      [phone]   - [facility]- [address]      [phone]      1st and 3rd Saturday every month, 10am-1pm Other agencies that provide inexpensive medical care:    [facility]  [phone]    [facility]  [phone]    [facility]  [phone]    [facility]  [phone]    [facility]  [phone]  General Information: Finding a doctor  when you do not have health insurance can be tricky. Although you are not limited by an insurance plan, you are of course limited by her finances and how much but he can pay out of pocket.  What are your options if you don't have health insurance?   1) Find a Librarian, academic and Pay Out of Pocket Although you won't have to find out who is covered by your insurance plan, it is a good idea to ask around and get recommendations. You will then need to call the office and see if the doctor you have chosen will accept you as a new patient and what types of options they offer for patients who are self-pay. Some doctors offer discounts or will set up payment plans for their patients who do not have insurance, but you will need to ask so you aren't surprised when you get to your appointment.  2) Contact Your Local Health Department Not all health departments have doctors that can see patients for sick visits, but many do, so it is worth a call to see if yours does. If you don't know where your local health department is, you can check in your phone book. The CDC also has a tool to help you locate your state's health department, and many state websites also have listings of all of their local health departments.  3) Find a Walk-in Clinic If your illness is not likely to be very severe or complicated, you may want to try a walk in clinic. These are popping up all over the country in pharmacies, drugstores, and shopping centers. They're usually staffed by nurse practitioners or physician assistants that have been trained to treat common illnesses and complaints. They're usually fairly quick and inexpensive. However, if you have serious medical issues or chronic medical problems, these are probably not your best option   Dental Assistance If the dentist on-call cannot see you, please use the resources below:   Patients with Medicaid: [facility]  [facility] [id number], [phone] [address] [phone]  If unable to pay, or uninsured, contact [facility] [phone]) or [facility] Department [phone] in [place], [phone] in [facility]) to become qualified for the adult dental clinic  Other Low-Cost Community Dental Services: [facility]- [address] Natasha Bence [place], [place], [address]    [phone], Ext. [phone]    2nd and 4th Thursday of the month at 6:30am    10 clients each day by appointment, can sometimes see walk-in     patients if someone does not show for an appointment [facility]- [address] [place], [place], [address]    [phone] [facility] [address] [place], [place], [address]    [phone]  [facility]- [phone] [facility] Health Department- [phone] [facility]- [phone]

## 2011-09-07 NOTE — ED Provider Notes (Signed)
History     CSN: [id number]  Arrival date & time 09/07/11  1726   First MD Initiated Contact with Patient 09/07/11 1840      Chief Complaint  Patient presents with  . Generalized Body Aches  . Dental Pain    (Consider location/radiation/quality/duration/timing/severity/associated sxs/prior treatment) HPI Comments: Patient reports chronic dental pain in her right upper molars.  States the pain has been constant for over a year and has been progressing over the past few days.  Patient also notes chronic pain in her lower back and bilateral hips, right elbow that are unchanged.  States she recently got medicaid and was seen at [facility], was prescribed multiple medications that did not help her pain.  Pt denies any change in pain.  Pain in her back and hips does not radiate down her legs, no weakness or numbness of the extremities.  Denies fevers, sore throat, difficulty swallowing or breathing.    Patient is a 36 y.o. female presenting with tooth pain. The history is provided by the patient.  Dental PainThe primary symptoms include mouth pain. Primary symptoms do not include oral bleeding, oral lesions, fever, shortness of breath, sore throat or cough. The symptoms began more than 1 month ago. The symptoms occur constantly.  Additional symptoms do not include: facial swelling, trouble swallowing and pain with swallowing.    No past medical history on file.  No past surgical history on file.  No family history on file.  History  Substance Use Topics  . Smoking status: Current Everyday Smoker -- 1.0 packs/day  . Smokeless tobacco: Not on file  . Alcohol Use: No    OB History    Grav Para Term Preterm Abortions TAB SAB Ect Mult Living                  Review of Systems  Constitutional: Negative for fever and chills.  HENT: Positive for dental problem. Negative for sore throat, facial swelling and trouble swallowing.   Respiratory: Negative for cough and shortness of  breath.   Cardiovascular: Negative for chest pain.  Gastrointestinal: Negative for nausea, vomiting, abdominal pain and diarrhea.  Musculoskeletal: Positive for arthralgias.    Allergies  Vicodin  Home Medications   Current Outpatient Rx  Name Route Sig Dispense Refill  . ALPRAZOLAM 1 MG PO TABS Oral Take 1 mg by mouth at bedtime as needed. For sleep    . MELOXICAM 7.5 MG PO TABS Oral Take 1 tablet (7.5 mg total) by mouth daily. 30 tablet 0  . PENICILLIN V POTASSIUM 500 MG PO TABS Oral Take 1 tablet (500 mg total) by mouth 4 (four) times daily. 40 tablet 0  . PERCOCET 5-325 MG PO TABS Oral Take 1 tablet by mouth every 6 (six) hours as needed for pain. 15 tablet 0    Dispense as written.    BP 101/62  Pulse 80  Temp(Src) 98.3 F (36.8 C) (Oral)  Resp 18  SpO2 95%  LMP 09/05/2011  Physical Exam  Nursing note and vitals reviewed. Constitutional: She is oriented to person, place, and time. She appears well-developed and well-nourished.  HENT:  Head: Normocephalic and atraumatic.  Mouth/Throat: Oropharynx is clear and moist. No oropharyngeal exudate.    Neck: Neck supple.  Cardiovascular: Normal rate and regular rhythm.   Pulmonary/Chest: Effort normal and breath sounds normal.  Neurological: She is alert and oriented to person, place, and time.    ED Course  Procedures (including critical care time)  Labs Reviewed - No data to display No results found.   1. Pain, dental   2. Chronic pain       MDM  Patient with recent worsening of chronic dental pain with severe decay in several teeth and tenderness to palpation.  Also with chronic pain from what is likely arthritis from patient's description.  She has a PCP she can follow up with that she is not happy with.  I have given her resources for PCP and dentists, as well as the dentist on call. Pt d/c home with percocet (allergic to vicodin - hives), penicillin.  Patient verbalizes understanding and agrees with plan.           Dillard Cannon [place], [place] 09/07/11 2219

## 2011-09-14 NOTE — ED Provider Notes (Signed)
History/physical exam/procedure(s) were performed by non-physician practitioner and as supervising physician I was immediately available for consultation/collaboration. I have reviewed all notes and am in agreement with care and plan.   Nathaneil Feagans S Shyann Hefner, MD 09/14/11 1723 

## 2012-03-17 ENCOUNTER — Emergency Department (HOSPITAL_COMMUNITY)
Admission: EM | Admit: 2012-03-17 | Discharge: 2012-03-17 | Disposition: A | Discharge status: Home / Self Care | Payer: Medicaid Other | Attending: Emergency Medicine | Admitting: Emergency Medicine

## 2012-03-17 ENCOUNTER — Encounter (HOSPITAL_COMMUNITY): Payer: Self-pay | Admitting: *Deleted

## 2012-03-17 DIAGNOSIS — R21 Rash and other nonspecific skin eruption: Secondary | ICD-10-CM | POA: Insufficient documentation

## 2012-03-17 DIAGNOSIS — L299 Pruritus, unspecified: Secondary | ICD-10-CM | POA: Insufficient documentation

## 2012-03-17 DIAGNOSIS — M25559 Pain in unspecified hip: Secondary | ICD-10-CM | POA: Insufficient documentation

## 2012-03-17 DIAGNOSIS — M545 Low back pain: Secondary | ICD-10-CM

## 2012-03-17 DIAGNOSIS — F17200 Nicotine dependence, unspecified, uncomplicated: Secondary | ICD-10-CM | POA: Insufficient documentation

## 2012-03-17 DIAGNOSIS — G8929 Other chronic pain: Secondary | ICD-10-CM | POA: Insufficient documentation

## 2012-03-17 MED ORDER — IBUPROFEN 600 MG PO TABS: 600.0000 mg | ORAL_TABLET | Freq: Four times a day (QID) | ORAL | Status: DC | PRN

## 2012-03-17 MED ORDER — HYDROXYZINE HCL 25 MG PO TABS: 25.0000 mg | ORAL_TABLET | Freq: Four times a day (QID) | ORAL | Status: DC

## 2012-03-17 NOTE — ED Notes (Signed)
Dr Yelverton at bedside.  

## 2012-03-17 NOTE — ED Notes (Signed)
Pt left after being seen by Dr. Ranae Palms; pt refused to wait and be assessed; pt eloped; Dr. Ranae Palms informed that pt left.

## 2012-03-17 NOTE — ED Notes (Signed)
Pt states rash not on her body; pt states rash comes and goes

## 2012-03-17 NOTE — ED Notes (Signed)
Pt denies numbness and tingling; pt ambulatory; pt mentating appropriately; pt denies injury to areas of pain.

## 2012-03-17 NOTE — ED Provider Notes (Signed)
History  This chart was scribed for Loren Racer, MD by Manuela Schwartz, ED scribe. This patient was seen in room TR08C/TR08C and the patient's care was started at 1649.   CSN: [id number]  Arrival date & time 03/17/12  1649   None     Chief Complaint  Patient presents with  . Rash  . Back Pain   The history is provided by the patient. No language interpreter was used.   Vanessa Walsh is a 36 y.o. female who presents to the Emergency Department complaining of chronic back pain which she states usually takes pain medicine for but is currently out of her medicine. She states hx of chronic back pain and denies any acute changes or recent injuries. She has not yet seen an orthopedist for this problem. She also states bilateral hip pain.  She also c/o circular intermittent skin rash which is itchy and appears as circles but currently doesn't have them. She states this is ongoing for years but is not sure what caused it.   History reviewed. No pertinent past medical history.  History reviewed. No pertinent past surgical history.  History reviewed. No pertinent family history.  History  Substance Use Topics  . Smoking status: Current Every Day Smoker -- 1.0 packs/day  . Smokeless tobacco: Not on file  . Alcohol Use: No    OB History    Grav Para Term Preterm Abortions TAB SAB Ect Mult Living                  Review of Systems  Constitutional: Negative for chills.  Respiratory: Negative for shortness of breath.   Gastrointestinal: Negative for nausea and vomiting.  Musculoskeletal: Positive for back pain (lower back pain).  All other systems reviewed and are negative.    Allergies  Review of patient's allergies indicates no known allergies.  Home Medications   Current Outpatient Rx  Name  Route  Sig  Dispense  Refill  . DIPHENHYDRAMINE HCL 25 MG PO TABS   Oral   Take 25 mg by mouth every 6 (six) hours as needed. For rash         . HYDROXYZINE HCL 25 MG PO TABS  Oral   Take 1 tablet (25 mg total) by mouth every 6 (six) hours.   12 tablet   0   . IBUPROFEN 600 MG PO TABS   Oral   Take 1 tablet (600 mg total) by mouth every 6 (six) hours as needed for pain.   30 tablet   0     Triage Vitals: BP 136/80  Pulse 83  Temp 98 F (36.7 C) (Oral)  Resp 20  SpO2 100%  Physical Exam  Nursing note and vitals reviewed. Constitutional: She is oriented to person, place, and time. She appears well-developed and well-nourished. No distress.  HENT:  Head: Normocephalic and atraumatic.  Eyes: EOM are normal.  Neck: Neck supple. No tracheal deviation present.  Cardiovascular: Normal rate.   Pulmonary/Chest: Effort normal. No respiratory distress.  Musculoskeletal: Normal range of motion.       Lower lumbar back tenderness, negative SLR. Normal motor strength BLE. Normal sensation.  Neurological: She is alert and oriented to person, place, and time.  Skin: Skin is warm and dry.       Skin is clear with no rashes present  Psychiatric: She has a normal mood and affect. Her behavior is normal.    ED Course  Procedures (including critical care time) DIAGNOSTIC STUDIES: Oxygen  Saturation is 100% on room air, normal by my interpretation.    COORDINATION OF CARE: At 610 PM Discussed treatment plan with patient which includes weight loss, anti-inflammatory meds, f/u with PMD Patient agrees.   At 615 PM Pt eloped eloped from triage, normal gait without assistance.  Labs Reviewed - No data to display No results found.   1. Low back pain   2. Rash       MDM  I personally performed the services described in this documentation, which was scribed in my presence. The recorded information has been reviewed and is accurate.          Loren Racer, MD 03/17/12 [phone]

## 2012-03-17 NOTE — ED Notes (Signed)
Pt has returned to room.

## 2012-03-17 NOTE — ED Notes (Addendum)
Pt in c/o lower back pain and bilateral hip pain, and also noted "circles" that come up on her skin when she wakes up, states its not a rash and has been seen for this before.  Pt states she always has pain in her back and hips, this pain is chronic and is not new and is out of medication for this.

## 2012-03-17 NOTE — ED Notes (Signed)
Pt given d/c teaching. Pt refused to take prescriptions home with her and left them at the bedside. Pt has been given follow up instructions. Pt does appear to be in any acute distress upon d/c.

## 2012-05-17 ENCOUNTER — Encounter (HOSPITAL_COMMUNITY): Payer: Self-pay | Admitting: Emergency Medicine

## 2012-05-17 ENCOUNTER — Emergency Department (HOSPITAL_COMMUNITY)
Admission: EM | Admit: 2012-05-17 | Discharge: 2012-05-18 | Disposition: A | Discharge status: Home / Self Care | Payer: Medicaid Other | Attending: Emergency Medicine | Admitting: Emergency Medicine

## 2012-05-17 DIAGNOSIS — F17200 Nicotine dependence, unspecified, uncomplicated: Secondary | ICD-10-CM | POA: Insufficient documentation

## 2012-05-17 DIAGNOSIS — Z3202 Encounter for pregnancy test, result negative: Secondary | ICD-10-CM | POA: Insufficient documentation

## 2012-05-17 DIAGNOSIS — R300 Dysuria: Secondary | ICD-10-CM

## 2012-05-17 LAB — URINALYSIS, ROUTINE W REFLEX MICROSCOPIC
Nitrite: NEGATIVE
Protein, ur: 30 mg/dL — AB
Specific Gravity, Urine: 1.037 — ABNORMAL HIGH (ref 1.005–1.030)
Urobilinogen, UA: 1 mg/dL (ref 0.0–1.0)

## 2012-05-17 LAB — POCT PREGNANCY, URINE: Preg Test, Ur: NEGATIVE

## 2012-05-17 LAB — URINE MICROSCOPIC-ADD ON

## 2012-05-17 NOTE — ED Notes (Signed)
C/o pain with urination x 1 week.  Reports she noticed a small amount of blood on tissue when she wiped today.

## 2012-05-18 LAB — WET PREP, GENITAL: Yeast Wet Prep HPF POC: NONE SEEN

## 2012-05-18 MED ORDER — PHENAZOPYRIDINE HCL 200 MG PO TABS: 200.0000 mg | ORAL_TABLET | Freq: Three times a day (TID) | ORAL | Status: DC | PRN

## 2012-05-18 MED ORDER — CIPROFLOXACIN HCL 500 MG PO TABS: 500.0000 mg | ORAL_TABLET | Freq: Two times a day (BID) | ORAL | Status: DC

## 2012-05-18 NOTE — ED Provider Notes (Signed)
Medical screening examination/treatment/procedure(s) were performed by non-physician practitioner and as supervising physician I was immediately available for consultation/collaboration.  Sunnie Nielsen, MD 05/18/12 [phone]

## 2012-05-18 NOTE — ED Provider Notes (Signed)
History     CSN: [id number]  Arrival date & time 05/17/12  2229   First MD Initiated Contact with Patient 05/17/12 2347      Chief Complaint  Patient presents with  . Dysuria    (Consider location/radiation/quality/duration/timing/severity/associated sxs/prior treatment) HPI Comments: Pt presents today for dysuria x 1 week.  States last week she started noticing an uncomfortable sensation while she urinated but has worsened over the past few days.  Has noticed a small amount of blood on toilet tissue after she wipes.  No hx of UTI or kidney stones. Has tried increasing water and taken OTC azo and cranberry pills without significant relief.  Admits to recent unprotected sex with a new female partner.  Concern for STDs and would like testing.  Denies any vaginal discharge, abdominal pain, SOB, fever, or chest pain.  Patient is a 37 y.o. female presenting with dysuria. The history is provided by the patient.  Dysuria     History reviewed. No pertinent past medical history.  History reviewed. No pertinent past surgical history.  No family history on file.  History  Substance Use Topics  . Smoking status: Current Every Day Smoker -- 1.00 packs/day  . Smokeless tobacco: Not on file  . Alcohol Use: No    OB History   Grav Para Term Preterm Abortions TAB SAB Ect Mult Living                  Review of Systems  Genitourinary: Positive for dysuria.  All other systems reviewed and are negative.    Allergies  Review of patient's allergies indicates no known allergies.  Home Medications   Current Outpatient Rx  Name  Route  Sig  Dispense  Refill  . diphenhydrAMINE (BENADRYL) 25 MG tablet   Oral   Take 25 mg by mouth every 6 (six) hours as needed. For rash         . hydrOXYzine (ATARAX/VISTARIL) 25 MG tablet   Oral   Take 1 tablet (25 mg total) by mouth every 6 (six) hours.   12 tablet   0   . ibuprofen (ADVIL,MOTRIN) 600 MG tablet   Oral   Take 1 tablet (600 mg  total) by mouth every 6 (six) hours as needed for pain.   30 tablet   0     BP 134/68  Temp(Src) 98.3 F (36.8 C) (Oral)  Resp 18  SpO2 100%  LMP 04/22/2012  Physical Exam  Nursing note and vitals reviewed. Constitutional: She is oriented to person, place, and time. She appears well-developed and well-nourished.  HENT:  Head: Normocephalic and atraumatic.  Mouth/Throat: Oropharynx is clear and moist.  Eyes: Conjunctivae and EOM are normal.  Neck: Normal range of motion.  Cardiovascular: Normal rate, regular rhythm and normal heart sounds.   Pulmonary/Chest: Effort normal and breath sounds normal.  Abdominal: Soft. Bowel sounds are normal. She exhibits no distension. There is no tenderness. There is no rebound, no guarding, no CVA tenderness, no tenderness at McBurney's point and negative Murphy's sign.  Genitourinary: There is no lesion on the right labia. There is no lesion on the left labia. There is bleeding (menstrual) around the vagina. No tenderness around the vagina.  Large amount of blood in the vagina, difficult to evaluate discharge.  Neurological: She is alert and oriented to person, place, and time.  Skin: Skin is warm and dry.  Psychiatric: Her speech is normal. Her mood appears anxious.    ED Course  Procedures (  including critical care time)  Labs Reviewed  WET PREP, GENITAL - Abnormal; Notable for the following:    WBC, Wet Prep HPF POC FEW (*)    All other components within normal limits  URINALYSIS, ROUTINE W REFLEX MICROSCOPIC - Abnormal; Notable for the following:    Color, Urine AMBER (*)    APPearance CLOUDY (*)    Specific Gravity, Urine 1.037 (*)    Hgb urine dipstick LARGE (*)    Bilirubin Urine SMALL (*)    Ketones, ur 15 (*)    Protein, ur 30 (*)    Leukocytes, UA SMALL (*)    All other components within normal limits  URINE MICROSCOPIC-ADD ON - Abnormal; Notable for the following:    Squamous Epithelial / LPF MANY (*)    Bacteria, UA FEW  (*)    All other components within normal limits  URINE CULTURE  GC/CHLAMYDIA PROBE AMP  POCT PREGNANCY, URINE   No results found.   1. Dysuria       MDM  12:27 AM Pelvic exam complete.  Bleeding is from the vagina, not urethra.  No lesions, masses, or purulent discharge noted.  Wet prep and gc/chl pending.  12:56 AM Will treat with short course abx and pyridium.  Urine culture ordered. Informed that she will be contacted in the event that her GC/CHL: are abnormal and need treatment.  Asked for additional information about the STD's she was tested for and dysuria- will attach to d/c paperwork.    Garlon Hatchet, PA-C 05/18/12 [phone]

## 2012-05-19 LAB — URINE CULTURE: Colony Count: 65000

## 2012-05-19 LAB — GC/CHLAMYDIA PROBE AMP: CT Probe RNA: NEGATIVE

## 2012-12-10 ENCOUNTER — Emergency Department (HOSPITAL_COMMUNITY)
Admission: EM | Admit: 2012-12-10 | Discharge: 2012-12-10 | Disposition: A | Discharge status: Home / Self Care | Payer: Medicaid Other | Attending: Emergency Medicine | Admitting: Emergency Medicine

## 2012-12-10 ENCOUNTER — Encounter (HOSPITAL_COMMUNITY): Payer: Self-pay | Admitting: Emergency Medicine

## 2012-12-10 DIAGNOSIS — F17200 Nicotine dependence, unspecified, uncomplicated: Secondary | ICD-10-CM | POA: Insufficient documentation

## 2012-12-10 DIAGNOSIS — N12 Tubulo-interstitial nephritis, not specified as acute or chronic: Secondary | ICD-10-CM | POA: Insufficient documentation

## 2012-12-10 DIAGNOSIS — R509 Fever, unspecified: Secondary | ICD-10-CM | POA: Insufficient documentation

## 2012-12-10 DIAGNOSIS — Z3202 Encounter for pregnancy test, result negative: Secondary | ICD-10-CM | POA: Insufficient documentation

## 2012-12-10 DIAGNOSIS — R51 Headache: Secondary | ICD-10-CM | POA: Insufficient documentation

## 2012-12-10 DIAGNOSIS — M549 Dorsalgia, unspecified: Secondary | ICD-10-CM | POA: Insufficient documentation

## 2012-12-10 DIAGNOSIS — R197 Diarrhea, unspecified: Secondary | ICD-10-CM | POA: Insufficient documentation

## 2012-12-10 DIAGNOSIS — R42 Dizziness and giddiness: Secondary | ICD-10-CM | POA: Insufficient documentation

## 2012-12-10 DIAGNOSIS — R630 Anorexia: Secondary | ICD-10-CM | POA: Insufficient documentation

## 2012-12-10 DIAGNOSIS — Z8742 Personal history of other diseases of the female genital tract: Secondary | ICD-10-CM | POA: Insufficient documentation

## 2012-12-10 DIAGNOSIS — Z87440 Personal history of urinary (tract) infections: Secondary | ICD-10-CM | POA: Insufficient documentation

## 2012-12-10 LAB — PREGNANCY, URINE: Preg Test, Ur: NEGATIVE

## 2012-12-10 LAB — URINE MICROSCOPIC-ADD ON

## 2012-12-10 LAB — CBC WITH DIFFERENTIAL/PLATELET
Basophils Relative: 0 % (ref 0–1)
Eosinophils Absolute: 0 10*3/uL (ref 0.0–0.7)
Eosinophils Relative: 0 % (ref 0–5)
MCH: 34.2 pg — ABNORMAL HIGH (ref 26.0–34.0)
MCHC: 34.6 g/dL (ref 30.0–36.0)
MCV: 99 fL (ref 78.0–100.0)
Neutrophils Relative %: 88 % — ABNORMAL HIGH (ref 43–77)
Platelets: 160 10*3/uL (ref 150–400)

## 2012-12-10 LAB — BASIC METABOLIC PANEL
BUN: 5 mg/dL — ABNORMAL LOW (ref 6–23)
Calcium: 9.3 mg/dL (ref 8.4–10.5)
GFR calc Af Amer: >90 mL/min (ref >90)
GFR calc non Af Amer: 84 mL/min — ABNORMAL LOW (ref >90)
Potassium: 3.9 mEq/L (ref 3.5–5.1)
Sodium: 137 mEq/L (ref 135–145)

## 2012-12-10 LAB — URINALYSIS, ROUTINE W REFLEX MICROSCOPIC
Glucose, UA: NEGATIVE mg/dL
Hgb urine dipstick: NEGATIVE
Ketones, ur: NEGATIVE mg/dL
Protein, ur: NEGATIVE mg/dL
Urobilinogen, UA: 1 mg/dL (ref 0.0–1.0)

## 2012-12-10 MED ORDER — SODIUM CHLORIDE 0.9 % IV BOLUS (SEPSIS)
1000.0000 mL | Freq: Once | INTRAVENOUS | Status: AC
  Administered 2012-12-10: 1000 mL via INTRAVENOUS

## 2012-12-10 MED ORDER — LEVOFLOXACIN 750 MG PO TABS: 750.0000 mg | ORAL_TABLET | Freq: Every day | ORAL | Status: DC

## 2012-12-10 MED ORDER — IBUPROFEN 800 MG PO TABS
800.0000 mg | ORAL_TABLET | Freq: Once | ORAL | Status: DC
  Filled 2012-12-10: qty 1

## 2012-12-10 MED ORDER — LEVOFLOXACIN 750 MG PO TABS
750.0000 mg | ORAL_TABLET | Freq: Once | ORAL | Status: AC
  Administered 2012-12-10: 750 mg via ORAL
  Filled 2012-12-10: qty 1

## 2012-12-10 MED ORDER — KETOROLAC TROMETHAMINE 30 MG/ML IJ SOLN
30.0000 mg | Freq: Once | INTRAMUSCULAR | Status: AC
  Administered 2012-12-10: 30 mg via INTRAVENOUS
  Filled 2012-12-10: qty 1

## 2012-12-10 MED ORDER — ACETAMINOPHEN 325 MG PO TABS
650.0000 mg | ORAL_TABLET | Freq: Once | ORAL | Status: AC
  Administered 2012-12-10: 650 mg via ORAL
  Filled 2012-12-10: qty 2

## 2012-12-10 MED ORDER — MORPHINE SULFATE 4 MG/ML IJ SOLN
6.0000 mg | Freq: Once | INTRAMUSCULAR | Status: AC
  Administered 2012-12-10: 6 mg via INTRAVENOUS
  Filled 2012-12-10: qty 2

## 2012-12-10 MED ORDER — NAPROXEN 500 MG PO TABS: 500.0000 mg | ORAL_TABLET | Freq: Two times a day (BID) | ORAL | Status: DC

## 2012-12-10 NOTE — ED Notes (Signed)
Pt BIB EMS. Pt states she began three days ago with diarrhea, fevers and back pain. Decreased PO intake.

## 2012-12-10 NOTE — ED Provider Notes (Signed)
CSN: [id number]     Arrival date & time 12/10/12  1321 History   First MD Initiated Contact with Patient 12/10/12 1322     Chief Complaint  Patient presents with  . Influenza   (Consider location/radiation/quality/duration/timing/severity/associated sxs/prior Treatment) HPI Comments: 37 yo female with smoking hx and UTI in the past presents with fever for two days, diarrhea and back pain.  Pt has had back pain for three years but worse the past 3 days, middle/ lower, worse with palpation.  Fevers and decreased intake.  No abd pain or vomiting.  No vaginal sxs.  No sick contacts.  No DM/ immunosuppression, IVDU or back surgery hx.  Nothing improves but has not taken any medicines.   Patient is a 37 y.o. female presenting with flu symptoms. The history is provided by the patient.  Influenza Presenting symptoms: diarrhea, fever and headache (mild general, similar previous)   Presenting symptoms: no sore throat and no vomiting   Associated symptoms: chills   Associated symptoms: no neck stiffness     History reviewed. No pertinent past medical history. Past Surgical History  Procedure Laterality Date  . Ovarian cyst surgery     No family history on file. History  Substance Use Topics  . Smoking status: Current Every Day Smoker -- 1.00 packs/day  . Smokeless tobacco: Not on file  . Alcohol Use: No   OB History   Grav Para Term Preterm Abortions TAB SAB Ect Mult Living                 Review of Systems  Constitutional: Positive for fever, chills and appetite change.  HENT: Negative for sore throat, neck pain and neck stiffness.   Eyes: Negative for photophobia and visual disturbance.  Gastrointestinal: Positive for diarrhea. Negative for vomiting and abdominal pain.  Genitourinary: Negative for dysuria and difficulty urinating.  Musculoskeletal: Positive for back pain.  Skin: Negative for rash.  Neurological: Positive for light-headedness and headaches (mild general, similar  previous).    Allergies  Review of patient's allergies indicates no known allergies.  Home Medications   Current Outpatient Rx  Name  Route  Sig  Dispense  Refill  . diphenhydrAMINE (BENADRYL) 25 MG tablet   Oral   Take 25 mg by mouth every 6 (six) hours as needed. For rash         . ibuprofen (ADVIL,MOTRIN) 600 MG tablet   Oral   Take 1 tablet (600 mg total) by mouth every 6 (six) hours as needed for pain.   30 tablet   0   . phenazopyridine (PYRIDIUM) 200 MG tablet   Oral   Take 1 tablet (200 mg total) by mouth 3 (three) times daily as needed.   9 tablet   0    BP 110/61  Pulse 95  Temp(Src) 102.7 F (39.3 C) (Oral)  Resp 20  SpO2 98%  LMP 11/30/2012 Physical Exam  Nursing note and vitals reviewed. Constitutional: She is oriented to person, place, and time. She appears well-developed and well-nourished.  HENT:  Head: Normocephalic and atraumatic.  Mouth/Throat: No oropharyngeal exudate.  Dry mm  Eyes: Conjunctivae are normal. Right eye exhibits no discharge. Left eye exhibits no discharge.  Neck: Normal range of motion. Neck supple. No tracheal deviation present.  Cardiovascular: Normal rate and regular rhythm.   No murmur heard. Pulmonary/Chest: Effort normal and breath sounds normal.  Abdominal: Soft. She exhibits no distension. There is no tenderness. There is no guarding.  Musculoskeletal: She exhibits  tenderness. She exhibits no edema.  Tender both midline and paraspinal lumbar  Lymphadenopathy:    She has no cervical adenopathy.  Neurological: She is alert and oriented to person, place, and time. No cranial nerve deficit.  Skin: Skin is warm. No rash noted.  Psychiatric: She has a normal mood and affect.    ED Course  Procedures (including critical care time) Labs Review Labs Reviewed  CBC WITH DIFFERENTIAL - Abnormal; Notable for the following:    RBC 3.83 (*)    MCH 34.2 (*)    Neutrophils Relative % 88 (*)    Neutro Abs 9.0 (*)     Lymphocytes Relative 8 (*)    All other components within normal limits  BASIC METABOLIC PANEL - Abnormal; Notable for the following:    Glucose, Bld 115 (*)    BUN 5 (*)    GFR calc non Af Amer 84 (*)    All other components within normal limits  SEDIMENTATION RATE - Abnormal; Notable for the following:    Sed Rate 25 (*)    All other components within normal limits  URINALYSIS, ROUTINE W REFLEX MICROSCOPIC - Abnormal; Notable for the following:    Leukocytes, UA MODERATE (*)    All other components within normal limits  URINE MICROSCOPIC-ADD ON - Abnormal; Notable for the following:    Squamous Epithelial / LPF FEW (*)    Bacteria, UA MANY (*)    All other components within normal limits  URINE CULTURE  PREGNANCY, URINE   Imaging Review No results found.  MDM  No diagnosis found. Concern for pyelonephritis, other differentials discitis, kidney stone, viral syndrome, enteritis.  Fluids, pain meds, antipyretics, UA.  PO abx and fluids in ED.  Close fup discussed.  Pt improved on recheck.  Filed Vitals:   12/10/12 1321 12/10/12 1534  BP: 110/61 103/57  Pulse: 95 85  Temp: 102.7 F (39.3 C) 99.4 F (37.4 C)  TempSrc: Oral Oral  Resp: 20 18  SpO2: 98% 99%    Pyelonephritis, Dehydration   Enid Skeens, MD 12/14/12 [phone]

## 2012-12-12 LAB — URINE CULTURE: Colony Count: >=100000

## 2012-12-13 ENCOUNTER — Telehealth (HOSPITAL_COMMUNITY): Payer: Self-pay | Admitting: Emergency Medicine

## 2012-12-13 NOTE — ED Notes (Signed)
Post ED Visit - Positive Culture Follow-up  Culture report reviewed by antimicrobial stewardship pharmacist: []  Wes Dulaney, Pharm.D., BCPS []  Celedonio Miyamoto, Pharm.D., BCPS []  Georgina Pillion, [address].D., BCPS []  [place], [address].D., BCPS, AAHIVP []  Estella Husk, Pharm.D., BCPS, AAHIVP [x]  Abran Duke, [address].D., BCPS  Positive urine culture Treated with Levofloxacin, organism sensitive to the same and no further patient follow-up is required at this time.  Kylie A Holland 12/13/2012, 10:53 AM

## 2012-12-18 ENCOUNTER — Encounter (HOSPITAL_COMMUNITY): Payer: Self-pay

## 2012-12-18 ENCOUNTER — Emergency Department (INDEPENDENT_AMBULATORY_CARE_PROVIDER_SITE_OTHER)
Admission: EM | Admit: 2012-12-18 | Discharge: 2012-12-18 | Disposition: A | Discharge status: Home / Self Care | Payer: Medicaid Other | Source: Home / Self Care | Attending: Emergency Medicine | Admitting: Emergency Medicine

## 2012-12-18 DIAGNOSIS — S39012A Strain of muscle, fascia and tendon of lower back, initial encounter: Secondary | ICD-10-CM

## 2012-12-18 DIAGNOSIS — G8929 Other chronic pain: Secondary | ICD-10-CM

## 2012-12-18 DIAGNOSIS — K047 Periapical abscess without sinus: Secondary | ICD-10-CM

## 2012-12-18 DIAGNOSIS — M545 Low back pain: Secondary | ICD-10-CM

## 2012-12-18 DIAGNOSIS — M797 Fibromyalgia: Secondary | ICD-10-CM

## 2012-12-18 LAB — POCT URINALYSIS DIP (DEVICE)
Bilirubin Urine: NEGATIVE
Hgb urine dipstick: NEGATIVE
Ketones, ur: NEGATIVE mg/dL
Protein, ur: NEGATIVE mg/dL
Specific Gravity, Urine: 1.02 (ref 1.005–1.030)
pH: 7.5 (ref 5.0–8.0)

## 2012-12-18 MED ORDER — CYCLOBENZAPRINE HCL 5 MG PO TABS: 5.0000 mg | ORAL_TABLET | Freq: Three times a day (TID) | ORAL | Status: DC | PRN

## 2012-12-18 MED ORDER — AMITRIPTYLINE HCL 25 MG PO TABS: 25.0000 mg | ORAL_TABLET | Freq: Every day | ORAL | Status: DC

## 2012-12-18 MED ORDER — CLINDAMYCIN HCL 300 MG PO CAPS: 300.0000 mg | ORAL_CAPSULE | Freq: Four times a day (QID) | ORAL | Status: DC

## 2012-12-18 MED ORDER — GABAPENTIN 100 MG PO CAPS: 100.0000 mg | ORAL_CAPSULE | Freq: Three times a day (TID) | ORAL | Status: DC

## 2012-12-18 NOTE — ED Provider Notes (Signed)
Chief Complaint:   Chief Complaint  Patient presents with  . Back Pain    History of Present Illness:   Vanessa Walsh is a 37 year old female who presents tonight following a fall. She is asking for a prescription for Percocet. Her story is full of inconsistencies and contradictions. The patient states she fell last night down 25 steps, but there was no loss of consciousness. She denies hitting her head or any trauma to the face, but states the fall caused her to break a tooth. The tooth she points to is her right, upper, second molar. Looking at the tooth, does not look acutely fractured. 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 The patient states that she also injured her back in the fall. She states that she's had lower back pain for at least 3 years and the pain is no worse than her usual back pain. She describes pain in the lower back rated 10 over 10 without radiation. She was at the emergency room one week ago for some similar symptoms. She was diagnosed with a urinary tract infection and given Levaquin, 4 tablets which she has not finished up yet. She was given naproxen which she states she could not take. She was also given some Percocet at that time. She wanted to have Percocet refilled. Back pain does not radiate and she denies any numbness, tingling, or weakness in the lower extremities. [facility]. She's had scans of her back and x-rays. She's been told she has fibromyalgia. She denies any neurological symptoms, nausea, vomiting, headache, neck pain, chest pain, abdominal pain, or extremity pain.  Review of Systems:  Other than noted above, the patient denies any of the following symptoms: Systemic:  No fevers or chills. Eye:  No diplopia or blurred vision. ENT:  No headache, facial pain, or bleeding from the nose or ears.  No loose or broken teeth. Neck:  No neck pain or stiffnes. Resp:  No shortness of  breath. Cardiac:  No chest pain. No palpitations, dizziness, syncope or fainting. GI:  No abdominal pain. No nausea, vomiting, or diarrhea. GU:  No blood in urine. M-S:  No extremity pain, swelling, bruising, limited ROM, neck or back pain. Neuro:  No headache, loss of consciousness, seizure activity, dizziness, vertigo, paresthesias, numbness, or weakness.  No difficulty with speech or ambulation.   PMFSH:  Past medical history, family history, social history, meds, and allergies were reviewed.  The patient states she's allergic to aspirin and nonsteroidal anti-inflammatories but states she has taken these in the past. She takes no medications on a regular basis. She's finishing up her prescription for Levaquin. She has been diagnosed with fibromyalgia.  Physical Exam:   Vital signs:  BP 147/87  Pulse 63  Temp(Src) 98.1 F (36.7 C) (Oral)  Resp 20  SpO2 96%  LMP 11/30/2012 General:  Alert, oriented and in no distress. Eye:  PERRL, full EOMs. ENT:  No cranial or facial tenderness to palpation. She has widespread dental decay. There no acutely fractured teeth. Her right, upper, second molar is tender to palpation. There is no gingival swelling, no swelling of the floor the mouth. Neck:  No tenderness to palpation.  Full ROM without pain. Heart:  Regular rhythm.  No extrasystoles, gallops, or murmers. Lungs:  No chest wall tenderness to palpation. Breath sounds clear and equal bilaterally.  No wheezes, rales or rhonchi. Abdomen:  Non tender. Back:  She has moderate tenderness to palpation and limited range of motion with pain. Straight leg raising is negative. Extremities:  No tenderness, swelling, bruising or deformity.  Full ROM of all joints without pain.  Pulses full.  Brisk capillary refill. Neuro:  Alert and oriented times 3.  Cranial nerves intact.  No muscle weakness.  Sensation intact to light touch.  Gait normal. Skin:  No bruising, abrasions, or lacerations.  Results for orders  placed during the hospital encounter of 12/18/12  POCT URINALYSIS DIP (DEVICE)      Result Value Range   Glucose, UA NEGATIVE  NEGATIVE mg/dL   Bilirubin Urine NEGATIVE  NEGATIVE   Ketones, ur NEGATIVE  NEGATIVE mg/dL   Specific Gravity, Urine 1.020  1.005 - 1.030   Hgb urine dipstick NEGATIVE  NEGATIVE   pH 7.5  5.0 - 8.0   Protein, ur NEGATIVE  NEGATIVE mg/dL   Urobilinogen, UA 1.0  0.0 - 1.0 mg/dL   Nitrite NEGATIVE  NEGATIVE   Leukocytes, UA TRACE (*) NEGATIVE   Assessment:  The primary encounter diagnosis was Chronic low back pain. Diagnoses of Dental abscess, Fibromyalgia, and Lumbar strain, initial encounter were also pertinent to this visit.  I think her back pain is due to fibromyalgia and has nothing to do with a full. She states the pain is no worse than her usual back pain. I don't see any evidence of an acutely fractured tooth. I am concerned about the possibility of drug-seeking behavior. I told her I would give her some antibiotics with a tooth, muscle relaxers, and amitriptyline and gabapentin for the fibromyalgia pain. She wasn't happy with these medications and persisted at her request for Percocet.  Plan:   1.  Meds:  The following meds were prescribed:   Discharge Medication List as of 12/18/2012  6:37 PM    START taking these medications   Details  amitriptyline (ELAVIL) 25 MG tablet Take 1 tablet (25 mg total) by mouth at bedtime., Starting 12/18/2012, Until Discontinued, Normal    clindamycin (CLEOCIN) 300 MG capsule Take 1 capsule (300 mg total) by mouth 4 (four) times daily., Starting 12/18/2012, Until Discontinued, Normal    cyclobenzaprine (FLEXERIL) 5 MG tablet Take 1 tablet (5 mg total) by mouth 3 (three) times daily as needed for muscle spasms., Starting 12/18/2012, Until Discontinued, Normal    gabapentin (NEURONTIN) 100 MG capsule Take 1 capsule (100 mg total) by mouth 3 (three) times daily., Starting 12/18/2012, Until Discontinued, Normal        2.   Patient Education/Counseling:  The patient was given appropriate handouts, self care instructions, and instructed in symptomatic relief.  Given back exercises to do. Suggested she followup with a dentist as soon as possible.  3.  Follow up:  The patient was told to follow up if no better in 3 to 4 days, if becoming worse in any way, and given some red flag symptoms such as worsening pain or neurological symptoms which would prompt immediate return.  Follow up with a primary care physician. She has been [facility] from [facility], and she'll need to find a new primary care physician. She'll also need a followup with a dentist for her multiple carious teeth.     Reuben Likes, MD 12/18/12 2124

## 2012-12-18 NOTE — ED Notes (Signed)
States she fell a couple of days ago, and has had pain i her lower back and a tooth in her right lower jaw are since the fall; NAD, but c/o pain is 10 on 1-10 scale

## 2013-01-24 ENCOUNTER — Encounter (HOSPITAL_COMMUNITY): Payer: Self-pay | Admitting: Emergency Medicine

## 2013-01-24 ENCOUNTER — Emergency Department (HOSPITAL_COMMUNITY)
Admission: EM | Admit: 2013-01-24 | Discharge: 2013-01-24 | Disposition: A | Discharge status: Home / Self Care | Payer: Medicaid Other | Attending: Emergency Medicine | Admitting: Emergency Medicine

## 2013-01-24 DIAGNOSIS — G8929 Other chronic pain: Secondary | ICD-10-CM | POA: Insufficient documentation

## 2013-01-24 DIAGNOSIS — M549 Dorsalgia, unspecified: Secondary | ICD-10-CM | POA: Insufficient documentation

## 2013-01-24 DIAGNOSIS — Z79899 Other long term (current) drug therapy: Secondary | ICD-10-CM | POA: Insufficient documentation

## 2013-01-24 DIAGNOSIS — F17200 Nicotine dependence, unspecified, uncomplicated: Secondary | ICD-10-CM | POA: Insufficient documentation

## 2013-01-24 DIAGNOSIS — Z792 Long term (current) use of antibiotics: Secondary | ICD-10-CM | POA: Insufficient documentation

## 2013-01-24 MED ORDER — MELOXICAM 15 MG PO TABS: 15.0000 mg | ORAL_TABLET | Freq: Every day | ORAL | Status: DC

## 2013-01-24 MED ORDER — OXYCODONE-ACETAMINOPHEN 10-325 MG PO TABS: 0.5000 | ORAL_TABLET | Freq: Four times a day (QID) | ORAL | Status: DC | PRN

## 2013-01-24 MED ORDER — DEXAMETHASONE SODIUM PHOSPHATE 10 MG/ML IJ SOLN
10.0000 mg | Freq: Once | INTRAMUSCULAR | Status: AC
  Administered 2013-01-24: 10 mg via INTRAMUSCULAR
  Filled 2013-01-24: qty 1

## 2013-01-24 MED ORDER — KETOROLAC TROMETHAMINE 30 MG/ML IJ SOLN
30.0000 mg | Freq: Once | INTRAMUSCULAR | Status: AC
  Administered 2013-01-24: 30 mg via INTRAVENOUS
  Filled 2013-01-24: qty 1

## 2013-01-24 NOTE — ED Notes (Signed)
Pt here for back pain since Friday sts her back makes noise.

## 2013-01-24 NOTE — ED Provider Notes (Signed)
CSN: [id number]     Arrival date & time 01/24/13  1216 History  This chart was scribed for non-physician practitioner, Arthor Captain, PA-C working with Juliet Rude. Rubin Payor, MD by Greggory Stallion, ED scribe. This patient was seen in room TR10C/TR10C and the patient's care was started at 12:56 PM.   Chief Complaint  Patient presents with  . Back Pain   The history is provided by the patient. No language interpreter was used.   HPI Comments: Vanessa Walsh is a 37 y.o. female who presents to the Emergency Department complaining of worsening back pain that radiates into her right leg that started 2 days ago. She has history of chronic back pain for several years. Pt states her back pops with movement. Laying down temporarily relieves pain. Certain movement worsens the pain. Pt has taken tylenol/ibuprofen with no relief. She has not followed up with a specialist yet. Pt states her PCP has never gotten an MRI of her back or referred her to a neurosurgeon. She denies IV drug use, fever, dysuria, hematuria, bowel or bladder incontinence. Pt smokes cigarettes daily.   History reviewed. No pertinent past medical history. Past Surgical History  Procedure Laterality Date  . Ovarian cyst surgery     History reviewed. No pertinent family history. History  Substance Use Topics  . Smoking status: Current Every Day Smoker -- 1.00 packs/day  . Smokeless tobacco: Not on file  . Alcohol Use: No   OB History   Grav Para Term Preterm Abortions TAB SAB Ect Mult Living                 Review of Systems  Constitutional: Negative for fever.  Genitourinary: Negative for dysuria and hematuria.       Negative for bowel or bladder incontinence.   Musculoskeletal: Positive for back pain and myalgias.    Allergies  Review of patient's allergies indicates no known allergies.  Home Medications   Current Outpatient Rx  Name  Route  Sig  Dispense  Refill  . amitriptyline (ELAVIL) 25 MG tablet   Oral   Take 1  tablet (25 mg total) by mouth at bedtime.   15 tablet   0   . clindamycin (CLEOCIN) 300 MG capsule   Oral   Take 1 capsule (300 mg total) by mouth 4 (four) times daily.   40 capsule   0   . cyclobenzaprine (FLEXERIL) 5 MG tablet   Oral   Take 1 tablet (5 mg total) by mouth 3 (three) times daily as needed for muscle spasms.   30 tablet   0   . diphenhydrAMINE (BENADRYL) 25 MG tablet   Oral   Take 25 mg by mouth every 6 (six) hours as needed. For rash         . gabapentin (NEURONTIN) 100 MG capsule   Oral   Take 1 capsule (100 mg total) by mouth 3 (three) times daily.   45 capsule   0   . ibuprofen (ADVIL,MOTRIN) 600 MG tablet   Oral   Take 1 tablet (600 mg total) by mouth every 6 (six) hours as needed for pain.   30 tablet   0   . levofloxacin (LEVAQUIN) 750 MG tablet   Oral   Take 1 tablet (750 mg total) by mouth daily.   4 tablet   0   . naproxen (NAPROSYN) 500 MG tablet   Oral   Take 1 tablet (500 mg total) by mouth 2 (two) times daily.  15 tablet   0   . phenazopyridine (PYRIDIUM) 200 MG tablet   Oral   Take 1 tablet (200 mg total) by mouth 3 (three) times daily as needed.   9 tablet   0    BP 114/77  Pulse 79  Temp(Src) 97.9 F (36.6 C) (Oral)  Resp 18  Ht 5\' 5"  (1.651 m)  Wt 190 lb (86.183 kg)  BMI 31.62 kg/m2  SpO2 95%  Physical Exam  Nursing note and vitals reviewed. Constitutional: She is oriented to person, place, and time. She appears well-developed and well-nourished. No distress.  HENT:  Head: Normocephalic and atraumatic.  Eyes: EOM are normal.  Neck: Neck supple. No tracheal deviation present.  Cardiovascular: Normal rate.   Pulmonary/Chest: Effort normal. No respiratory distress.  Abdominal: Soft. There is no CVA tenderness.  Musculature in abdomen is weak and deflated.   Musculoskeletal: Normal range of motion.  No midline tenderness. Right sided lumbar paraspinal tenderness and spasm. Exquisitely tender over bilateral SI  joint. Pt holds herself over SI joints.   Neurological: She is alert and oriented to person, place, and time. She has normal reflexes.  Skin: Skin is warm and dry.  Psychiatric: She has a normal mood and affect. Her behavior is normal.    ED Course  Procedures (including critical care time)  DIAGNOSTIC STUDIES: Oxygen Saturation is 95% on RA, normal by my interpretation.    COORDINATION OF CARE: 1:03 PM-Discussed treatment plan which includes pain medication and a steroid with pt at bedside and pt agreed to plan. Advised pt to follow up with orthopedics.   Labs Review Labs Reviewed - No data to display Imaging Review No results found.  EKG Interpretation   None       MDM   1. Back pain    Suspect SI jt dysfunction and sacroiliitis. IM Toradol and decadron. D/c with pain meds. F/u with pcp Gaylord Shih   I personally performed the services described in this documentation, which was scribed in my presence. The recorded information has been reviewed and is accurate.    Arthor Captain, PA-C 01/24/13 1419

## 2013-01-24 NOTE — ED Provider Notes (Signed)
Medical screening examination/treatment/procedure(s) were performed by non-physician practitioner and as supervising physician I was immediately available for consultation/collaboration.  Shantana Christon R. Kayo Zion, MD 01/24/13 1622 

## 2013-02-23 ENCOUNTER — Encounter (HOSPITAL_COMMUNITY): Payer: Self-pay | Admitting: Emergency Medicine

## 2013-02-23 ENCOUNTER — Emergency Department (HOSPITAL_COMMUNITY)
Admission: EM | Admit: 2013-02-23 | Discharge: 2013-02-23 | Disposition: A | Discharge status: Home / Self Care | Payer: Medicaid Other | Attending: Emergency Medicine | Admitting: Emergency Medicine

## 2013-02-23 ENCOUNTER — Emergency Department (HOSPITAL_COMMUNITY): Payer: Medicaid Other

## 2013-02-23 DIAGNOSIS — Y92009 Unspecified place in unspecified non-institutional (private) residence as the place of occurrence of the external cause: Secondary | ICD-10-CM | POA: Insufficient documentation

## 2013-02-23 DIAGNOSIS — Y9301 Activity, walking, marching and hiking: Secondary | ICD-10-CM | POA: Insufficient documentation

## 2013-02-23 DIAGNOSIS — IMO0002 Reserved for concepts with insufficient information to code with codable children: Secondary | ICD-10-CM | POA: Insufficient documentation

## 2013-02-23 DIAGNOSIS — W108XXA Fall (on) (from) other stairs and steps, initial encounter: Secondary | ICD-10-CM | POA: Insufficient documentation

## 2013-02-23 DIAGNOSIS — M549 Dorsalgia, unspecified: Secondary | ICD-10-CM

## 2013-02-23 DIAGNOSIS — F17200 Nicotine dependence, unspecified, uncomplicated: Secondary | ICD-10-CM | POA: Insufficient documentation

## 2013-02-23 MED ORDER — IBUPROFEN 400 MG PO TABS
800.0000 mg | ORAL_TABLET | Freq: Once | ORAL | Status: DC
  Filled 2013-02-23: qty 2

## 2013-02-23 MED ORDER — TRAMADOL HCL 50 MG PO TABS: 50.0000 mg | ORAL_TABLET | Freq: Four times a day (QID) | ORAL | Status: DC | PRN

## 2013-02-23 NOTE — ED Notes (Signed)
Pt declined Ibuprofen. PA aware, no new orders.

## 2013-02-23 NOTE — ED Notes (Addendum)
States that she fell down stairs aboutt 10  Steps was seen here for that  Has had  her rt hip and back pain since

## 2013-02-23 NOTE — ED Provider Notes (Signed)
Medical screening examination/treatment/procedure(s) were performed by non-physician practitioner and as supervising physician I was immediately available for consultation/collaboration.  Coburn Knaus T Jacinta Penalver, MD 02/23/13 2318 

## 2013-02-23 NOTE — ED Provider Notes (Signed)
CSN: [id number]     Arrival date & time 02/23/13  1452 History   First MD Initiated Contact with Patient 02/23/13 1501     Chief Complaint  Patient presents with  . Back Pain   (Consider location/radiation/quality/duration/timing/severity/associated sxs/prior Treatment) The history is provided by the patient and medical records.   This is a 37 y.o. F presenting to the ED for right hip and low back pain for the past week.  States she tripped and fell down approximately 10 wooden steps in her home last week.  Denies any head trauma or LOC.  States she has been able to ambulate but is walking with a slight limp.  Has been applying ice and taking OTC pain meds without noted improvement.  Denies any numbness or paresthesias of LE.  No loss of bowel or bladder function.  States she has been referred to neurosurgery for her back problems but appt is not until December and she could not wait that long to be seen.  History reviewed. No pertinent past medical history. Past Surgical History  Procedure Laterality Date  . Ovarian cyst surgery     No family history on file. History  Substance Use Topics  . Smoking status: Current Every Day Smoker -- 1.00 packs/day  . Smokeless tobacco: Not on file  . Alcohol Use: No   OB History   Grav Para Term Preterm Abortions TAB SAB Ect Mult Living                 Review of Systems  Musculoskeletal: Positive for arthralgias and back pain.  All other systems reviewed and are negative.    Allergies  Review of patient's allergies indicates no known allergies.  Home Medications  No current outpatient prescriptions on file. BP 99/66  Pulse 67  Temp(Src) 98.3 F (36.8 C)  Resp 19  SpO2 99%  Physical Exam  Nursing note and vitals reviewed. Constitutional: She is oriented to person, place, and time. She appears well-developed and well-nourished. No distress.  HENT:  Head: Normocephalic and atraumatic.  Mouth/Throat: Oropharynx is clear and moist.   Eyes: Conjunctivae and EOM are normal. Pupils are equal, round, and reactive to light.  Neck: Normal range of motion. Neck supple.  Cardiovascular: Normal rate, regular rhythm and normal heart sounds.   Pulmonary/Chest: Effort normal and breath sounds normal. No respiratory distress. She has no wheezes.  Musculoskeletal: Normal range of motion.  TTP of right lateral hip and lumbar spine; no midline-step off, bruising, or gross deformity; distal sensation intact; ambulating with slight gait favoring right leg  Neurological: She is alert and oriented to person, place, and time.  Skin: Skin is warm and dry. She is not diaphoretic.  Psychiatric: She has a normal mood and affect.    ED Course  Procedures (including critical care time) Labs Review Labs Reviewed - No data to display Imaging Review Dg Lumbar Spine Complete  02/23/2013   CLINICAL DATA:  Right greater than left.  EXAM: LUMBAR SPINE - COMPLETE 4+ VIEW  COMPARISON:  None.  FINDINGS: There are 5 nonrib bearing lumbar-type vertebral bodies. The vertebral body heights are maintained. The alignment is anatomic. There is no spondylolysis. There is no acute fracture or static listhesis. The disc spaces are maintained.  The SI joints are unremarkable.  IMPRESSION: No acute osseous injury of the lumbar spine.   Electronically Signed   By: Elige Ko   On: 02/23/2013 16:17   Dg Hip Complete Right  02/23/2013  CLINICAL DATA:  Right hip pain.  No injury.  EXAM: RIGHT HIP - COMPLETE 2+ VIEW  COMPARISON:  None.  FINDINGS: There is no evidence of hip fracture or dislocation. There is no evidence of arthropathy or other focal bone abnormality.  IMPRESSION: Negative.   Electronically Signed   By: Amie Portland M.D.   On: 02/23/2013 16:14    EKG Interpretation   None       MDM   1. Back pain    X-rays negative for acute injuries.  No signs/sx concerning for cauda equina.  Pt will be discharged with pain medication.  Pt has an appt to see  neurosurgery in December.  May FU with [facility] if problems occur before then.  Discussed plan with pt, she agreed.  Return precautions advised.  Garlon Hatchet, PA-C 02/23/13 [phone]

## 2013-02-26 ENCOUNTER — Emergency Department (HOSPITAL_COMMUNITY)
Admission: EM | Admit: 2013-02-26 | Discharge: 2013-02-26 | Disposition: A | Discharge status: Home / Self Care | Payer: Medicaid Other | Attending: Emergency Medicine | Admitting: Emergency Medicine

## 2013-02-26 DIAGNOSIS — M25559 Pain in unspecified hip: Secondary | ICD-10-CM | POA: Insufficient documentation

## 2013-02-26 DIAGNOSIS — G8929 Other chronic pain: Secondary | ICD-10-CM

## 2013-02-26 DIAGNOSIS — M549 Dorsalgia, unspecified: Secondary | ICD-10-CM | POA: Insufficient documentation

## 2013-02-26 DIAGNOSIS — F17200 Nicotine dependence, unspecified, uncomplicated: Secondary | ICD-10-CM | POA: Insufficient documentation

## 2013-02-26 MED ORDER — KETOROLAC TROMETHAMINE 60 MG/2ML IM SOLN
60.0000 mg | Freq: Once | INTRAMUSCULAR | Status: AC
  Administered 2013-02-26: 60 mg via INTRAMUSCULAR
  Filled 2013-02-26: qty 2

## 2013-02-26 NOTE — ED Notes (Signed)
Pt refused to sign and states she will go right back out in lobby and check back in. Charge RN aware.

## 2013-02-26 NOTE — ED Notes (Signed)
Pt reports lower back pain was seen here recently for and pain medicine not helping. Pt is tearful. Denies incontinence.

## 2013-02-26 NOTE — ED Notes (Signed)
RN attempted to discharge pt; pt reports she wants to see another doctor.

## 2013-02-26 NOTE — ED Notes (Signed)
Security paged to escort patient out per PA.

## 2013-02-26 NOTE — ED Provider Notes (Signed)
Medical screening examination/treatment/procedure(s) were performed by non-physician practitioner and as supervising physician I was immediately available for consultation/collaboration.  EKG Interpretation   None         Charles B. Bernette Mayers, MD 02/26/13 Jerene Bears

## 2013-02-26 NOTE — ED Notes (Signed)
Pt reporting that she wants a prescription to go home with and is demanding to see another MD; PA aware.

## 2013-02-26 NOTE — ED Provider Notes (Signed)
CSN: [id number]     Arrival date & time 02/26/13  1642 History  This chart was scribed for non-physician practitioner Johnnette Gourd, PA-C, working with Leonette Most B. Bernette Mayers, MD by Dorothey Baseman, ED Scribe. This patient was seen in room TR09C/TR09C and the patient's care was started at 5:25 PM.    Chief Complaint  Patient presents with  . Back Pain   The history is provided by the patient. No language interpreter was used.   HPI Comments: Vanessa Walsh is a 37 y.o. female who presents to the Emergency Department complaining of a constant pain to the lower back onset a few weeks ago secondary to a fall that has been progressively worsening. Patient reports that she was seen here after the fall and received x-rays that were negative and she was discharged with Tramadol. Patient reports that she has been taking the Tramadol at home without relief. Patient reports that she has an appointment to see a neurosurgeon on 03/22/2013. She denies any other pertinent medical history.   No past medical history on file. Past Surgical History  Procedure Laterality Date  . Ovarian cyst surgery     No family history on file. History  Substance Use Topics  . Smoking status: Current Every Day Smoker -- 1.00 packs/day  . Smokeless tobacco: Not on file  . Alcohol Use: No   OB History   Grav Para Term Preterm Abortions TAB SAB Ect Mult Living                 Review of Systems  A complete 10 system review of systems was obtained and all systems are negative except as noted in the HPI and PMH.   Allergies  Review of patient's allergies indicates no known allergies.  Home Medications   Current Outpatient Rx  Name  Route  Sig  Dispense  Refill  . traMADol (ULTRAM) 50 MG tablet   Oral   Take 1 tablet (50 mg total) by mouth every 6 (six) hours as needed.   15 tablet   0    Triage Vitals: BP 105/67  Pulse 87  Temp(Src) 97 F (36.1 C) (Oral)  Resp 12  SpO2 98%  LMP 01/27/2013  Physical Exam   Nursing note and vitals reviewed. Constitutional: She is oriented to person, place, and time. She appears well-developed and well-nourished. No distress.  HENT:  Head: Normocephalic and atraumatic.  Mouth/Throat: Oropharynx is clear and moist.  Eyes: Conjunctivae and EOM are normal.  Neck: Normal range of motion. Neck supple.  Cardiovascular: Normal rate, regular rhythm and normal heart sounds.   Pulmonary/Chest: Effort normal and breath sounds normal. No respiratory distress.  Musculoskeletal: Normal range of motion. She exhibits no edema.  Generalized tenderness across the lower back. Generalized tenderness across the lateral right hip.   Neurological: She is alert and oriented to person, place, and time. No sensory deficit.  Normal gait. Strength of lower extremities is 5/5 and equal bilaterally.   Skin: Skin is warm and dry.  Psychiatric: She has a normal mood and affect. Her behavior is normal.    ED Course  Procedures (including critical care time)  DIAGNOSTIC STUDIES: Oxygen Saturation is 98% on room air, normal by my interpretation.    COORDINATION OF CARE: 5:27 PM- Will order an injection of pain medication (toradol). Advised patient to apply ice to the area as needed and to follow up with her neurosurgeon. Discussed treatment plan with patient at bedside and patient verbalized agreement.  Labs Review Labs Reviewed - No data to display Imaging Review No results found.  EKG Interpretation   None       MDM   1. Back pain   2. Chronic pain    No red flags concerning patient's back pain. No s/s of central cord compression or cauda equina. Lower extremities are neurovascularly intact and patient is ambulating without difficulty.   I personally performed the services described in this documentation, which was scribed in my presence. The recorded information has been reviewed and is accurate.    Trevor Mace, PA-C 02/26/13 1733

## 2013-03-07 ENCOUNTER — Emergency Department (HOSPITAL_COMMUNITY)
Admission: EM | Admit: 2013-03-07 | Discharge: 2013-03-07 | Disposition: A | Discharge status: Home / Self Care | Payer: Medicaid Other | Attending: Emergency Medicine | Admitting: Emergency Medicine

## 2013-03-07 ENCOUNTER — Encounter (HOSPITAL_COMMUNITY): Payer: Self-pay | Admitting: Emergency Medicine

## 2013-03-07 DIAGNOSIS — H18829 Corneal disorder due to contact lens, unspecified eye: Secondary | ICD-10-CM | POA: Insufficient documentation

## 2013-03-07 DIAGNOSIS — H18821 Corneal disorder due to contact lens, right eye: Secondary | ICD-10-CM

## 2013-03-07 DIAGNOSIS — F17200 Nicotine dependence, unspecified, uncomplicated: Secondary | ICD-10-CM | POA: Insufficient documentation

## 2013-03-07 MED ORDER — OXYCODONE-ACETAMINOPHEN 5-325 MG PO TABS
1.0000 | ORAL_TABLET | Freq: Once | ORAL | Status: AC
  Administered 2013-03-07: 1 via ORAL
  Filled 2013-03-07: qty 1

## 2013-03-07 MED ORDER — PROPARACAINE HCL 0.5 % OP SOLN
2.0000 [drp] | Freq: Once | OPHTHALMIC | Status: AC
  Administered 2013-03-07: 2 [drp] via OPHTHALMIC
  Filled 2013-03-07: qty 30

## 2013-03-07 MED ORDER — MOXIFLOXACIN HCL 0.5 % OP SOLN: 1.0000 [drp] | OPHTHALMIC | Status: DC

## 2013-03-07 MED ORDER — FLUORESCEIN SODIUM 1 MG OP STRP
1.0000 | ORAL_STRIP | Freq: Once | OPHTHALMIC | Status: AC
  Administered 2013-03-07: 1 via OPHTHALMIC
  Filled 2013-03-07: qty 1

## 2013-03-07 MED ORDER — OXYCODONE-ACETAMINOPHEN 5-325 MG PO TABS: 1.0000 | ORAL_TABLET | Freq: Four times a day (QID) | ORAL | Status: DC | PRN

## 2013-03-07 NOTE — ED Notes (Signed)
Patient c/o right eye pain which started on Friday and has gotten worse.  Patient states right  eye is sensitive to light and vision slightly blurred patient wears contacts but they are currently not in. Patient describes pain  as excruciating feels like a "knife stabbing the back of  eyeball"

## 2013-03-07 NOTE — ED Provider Notes (Signed)
CSN: [id number]     Arrival date & time 03/07/13  1428 History   First MD Initiated Contact with Patient 03/07/13 1457     Chief Complaint  Patient presents with  . Eye Pain   (Consider location/radiation/quality/duration/timing/severity/associated sxs/prior Treatment) HPI Comments: Patient who is a contact lens wearer presents with gradual onset of R eye pain that is gradually worsening over the past 2 days. She has photophobia and pain is described as sharp in the back of her eye. She has very blurry vision without contacts and states her vision is even worse now. She sees spots, no dark areas or flashing lights. No redness or drainage. Patient wears contacts for long periods without taking them out. She has not worn them since the pain began. She has not used any medications other than ibuprofen which has not helped. No N/V. No URI symptoms.    Patient is a 37 y.o. female presenting with eye pain. The history is provided by the patient.  Eye Pain Associated symptoms include headaches. Pertinent negatives include no abdominal pain, chest pain, coughing, fever, myalgias, nausea, rash, sore throat or vomiting.    History reviewed. No pertinent past medical history. Past Surgical History  Procedure Laterality Date  . Ovarian cyst surgery     History reviewed. No pertinent family history. History  Substance Use Topics  . Smoking status: Current Every Day Smoker -- 1.00 packs/day    Types: Cigarettes  . Smokeless tobacco: Not on file  . Alcohol Use: No   OB History   Grav Para Term Preterm Abortions TAB SAB Ect Mult Living                 Review of Systems  Constitutional: Negative for fever.  HENT: Negative for rhinorrhea and sore throat.   Eyes: Positive for photophobia, pain and visual disturbance. Negative for discharge, redness and itching.  Respiratory: Negative for cough.   Cardiovascular: Negative for chest pain.  Gastrointestinal: Negative for nausea, vomiting,  abdominal pain and diarrhea.  Genitourinary: Negative for dysuria.  Musculoskeletal: Negative for myalgias.  Skin: Negative for rash.  Neurological: Positive for headaches.    Allergies  Tramadol  Home Medications   Current Outpatient Rx  Name  Route  Sig  Dispense  Refill  . moxifloxacin (VIGAMOX) 0.5 % ophthalmic solution   Right Eye   Place 1 drop into the right eye every 3 (three) hours.   3 mL   0   . oxyCODONE-acetaminophen (PERCOCET/ROXICET) 5-325 MG per tablet   Oral   Take 1-2 tablets by mouth every 6 (six) hours as needed for severe pain.   12 tablet   0    BP 103/62  Pulse 79  Temp(Src) 98.9 F (37.2 C) (Oral)  Resp 20  SpO2 99%  LMP 02/28/2013 Physical Exam  Nursing note and vitals reviewed. Constitutional: She appears well-developed and well-nourished.  HENT:  Head: Normocephalic and atraumatic.  Eyes: Pupils are equal, round, and reactive to light. Right eye exhibits chemosis (lateral). Right eye exhibits no discharge. Left eye exhibits no chemosis and no discharge. Right conjunctiva is injected (lateral). Right conjunctiva has no hemorrhage. Left conjunctiva is not injected. Left conjunctiva has no hemorrhage.  Slit lamp exam:      The right eye shows fluorescein uptake. The right eye shows no corneal abrasion and no corneal ulcer.       The left eye shows no fluorescein uptake.  Light shined in L eye does not cause R to  hurt. Hazy irregular fluorescein uptake with faint 'wispy' lines upper outer quadrant. No ulcerations or abrasions noted. No FB noted with lid eversion.   Neck: Normal range of motion. Neck supple.  Cardiovascular: Normal rate, regular rhythm and normal heart sounds.   Pulmonary/Chest: Effort normal and breath sounds normal.  Abdominal: Soft. There is no tenderness.  Neurological: She is alert.  Skin: Skin is warm and dry.  Psychiatric: She has a normal mood and affect.    ED Course  Procedures (including critical care time) Labs  Review Labs Reviewed - No data to display Imaging Review No results found.  EKG Interpretation   None      3:10 PM Patient seen and examined. Work-up initiated. Medications ordered.   Vital signs reviewed and are as follows: Filed Vitals:   03/07/13 1439  BP: 103/62  Pulse: 79  Temp: 98.9 F (37.2 C)  Resp: 20   3:54 PM D/w Dr. Elesa Massed. Will call ophtho for reccs.  Two drops of proparacaine instilled into affected eye.   Fluorescein strip applied to affected eye. Wood's lamp used to assess. No corneal abrasion identified. No foreign bodies noted. No visible hyphema.   Tonometry performed. Right eye pressure: 18, 16  Patient tolerated procedure well without immediate complication.   4:41 PM Spoke with Dr. Randon Goldsmith. Reccs vigamox, no wearing contact lenses. Info given and office will call with appointment tomorrow. Pt informed of conversation. Stressed no contact lens use.   Patient counseled on use of narcotic pain medications. Counseled not to combine these medications with others containing tylenol. Urged not to drink alcohol, drive, or perform any other activities that requires focus while taking these medications. The patient verbalizes understanding and agrees with the plan.  MDM   1. Contact lens-induced keratopathy, right    Suspect keratitis/conjunctivitis from prolonged contact lens use. Consulted ophtho by tele. Started on abx, pain control. Appt arranged for tomorrow.     Renne Crigler, PA-C 03/07/13 1650

## 2013-03-07 NOTE — ED Provider Notes (Signed)
Medical screening examination/treatment/procedure(s) were performed by non-physician practitioner and as supervising physician I was immediately available for consultation/collaboration.  EKG Interpretation   None         Kristen N Ward, DO 03/07/13 2144 

## 2013-03-17 ENCOUNTER — Ambulatory Visit: Payer: Medicaid Other | Admitting: Internal Medicine

## 2013-07-28 ENCOUNTER — Emergency Department (HOSPITAL_COMMUNITY)
Admission: EM | Admit: 2013-07-28 | Discharge: 2013-07-28 | Disposition: A | Discharge status: Home / Self Care | Payer: Medicaid Other | Attending: Emergency Medicine | Admitting: Emergency Medicine

## 2013-07-28 ENCOUNTER — Emergency Department (HOSPITAL_COMMUNITY): Payer: Medicaid Other

## 2013-07-28 ENCOUNTER — Encounter (HOSPITAL_COMMUNITY): Payer: Self-pay | Admitting: Emergency Medicine

## 2013-07-28 DIAGNOSIS — G8929 Other chronic pain: Secondary | ICD-10-CM | POA: Insufficient documentation

## 2013-07-28 DIAGNOSIS — M5136 Other intervertebral disc degeneration, lumbar region: Secondary | ICD-10-CM

## 2013-07-28 DIAGNOSIS — M51379 Other intervertebral disc degeneration, lumbosacral region without mention of lumbar back pain or lower extremity pain: Secondary | ICD-10-CM | POA: Insufficient documentation

## 2013-07-28 DIAGNOSIS — M5137 Other intervertebral disc degeneration, lumbosacral region: Secondary | ICD-10-CM | POA: Insufficient documentation

## 2013-07-28 DIAGNOSIS — F17200 Nicotine dependence, unspecified, uncomplicated: Secondary | ICD-10-CM | POA: Insufficient documentation

## 2013-07-28 DIAGNOSIS — Z792 Long term (current) use of antibiotics: Secondary | ICD-10-CM | POA: Insufficient documentation

## 2013-07-28 MED ORDER — HYDROCODONE-ACETAMINOPHEN 5-325 MG PO TABS: 1.0000 | ORAL_TABLET | Freq: Four times a day (QID) | ORAL | Status: DC | PRN

## 2013-07-28 MED ORDER — HYDROCODONE-ACETAMINOPHEN 5-325 MG PO TABS
1.0000 | ORAL_TABLET | Freq: Once | ORAL | Status: AC
  Administered 2013-07-28: 1 via ORAL
  Filled 2013-07-28: qty 1

## 2013-07-28 NOTE — ED Provider Notes (Signed)
CSN: [id number]     Arrival date & time 07/28/13  1148 History   This chart was scribed for non-physician practitioner, Vanessa LowerVrinda Arjun Hard, NP, working with Layla MawKristen N Ward, DO by Charline BillsEssence Howell, ED Scribe. This patient was seen in room WTR6/WTR6 and the patient's care was started at 12:02 PM.    Chief Complaint  Patient presents with  . Back Pain    The history is provided by the patient. No language interpreter was used.   HPI Comments: Vanessa Walsh is a 38 y.o. female, with a h/o chronic back pain, who presents to the Emergency Department complaining of lower R back pain onset 2 weeks ago. She reports "cracking" in her back that keeps her awake at night. Pt denies fall, injury and urinary symptoms. She has tried ibuprofen with no relief. She has never followed up with a specialist. LNMP was this month. Denies numbness weakness or incontinence.  No past medical history on file. Past Surgical History  Procedure Laterality Date  . Ovarian cyst surgery     No family history on file. History  Substance Use Topics  . Smoking status: Current Every Day Smoker -- 1.00 packs/day    Types: Cigarettes  . Smokeless tobacco: Not on file  . Alcohol Use: No   OB History   Grav Para Term Preterm Abortions TAB SAB Ect Mult Living                 Review of Systems  Genitourinary: Negative for dysuria, urgency and difficulty urinating.  Musculoskeletal: Positive for back pain.  All other systems reviewed and are negative.   Allergies  Tramadol  Home Medications   Prior to Admission medications   Medication Sig Start Date End Date Taking? Authorizing Provider  moxifloxacin (VIGAMOX) 0.5 % ophthalmic solution Place 1 drop into the right eye every 3 (three) hours. 03/07/13   Renne CriglerJoshua Geiple, PA-C  oxyCODONE-acetaminophen (PERCOCET/ROXICET) 5-325 MG per tablet Take 1-2 tablets by mouth every 6 (six) hours as needed for severe pain. 03/07/13   Renne CriglerJoshua Geiple, PA-C   Triage Vitals: BP 115/73   Pulse 84  Temp(Src) 98 F (36.7 C) (Oral)  Resp 18  SpO2 100%  LMP 07/19/2013 Physical Exam  Nursing note and vitals reviewed. Constitutional: She is oriented to person, place, and time. She appears well-developed and well-nourished.  HENT:  Head: Normocephalic and atraumatic.  Eyes: EOM are normal.  Neck: Neck supple.  Cardiovascular: Normal rate.   Pulmonary/Chest: Effort normal.  Neurological: She is alert and oriented to person, place, and time.  Skin: Skin is warm and dry.  Psychiatric: She has a normal mood and affect. Her behavior is normal.    ED Course  Procedures (including critical care time) DIAGNOSTIC STUDIES: Oxygen Saturation is 100% on RA, normal by my interpretation.    COORDINATION OF CARE: 12:06 PM-Discussed treatment plan which includes XR with pt at bedside and pt agreed to plan.   Labs Review Labs Reviewed - No data to display  Imaging Review Dg Lumbar Spine Complete  07/28/2013   CLINICAL DATA:  Low back pain for months, no known injury  EXAM: LUMBAR SPINE - COMPLETE 4+ VIEW  COMPARISON:  02/23/2013  FINDINGS: Five non-rib bearing lumbar vertebrae.  Osseous mineralization normal.  Minimal disc space narrowing L4-L5 anteriorly.  Vertebral body and disc space heights otherwise maintained.  No acute fracture, subluxation or bone destruction.  No spondylolysis.  SI joints symmetric.  IMPRESSION: Minimal degenerative disc disease changes at L4-L5.  No acute abnormalities.   Electronically Signed   By: Ulyses SouthwardMark  Boles M.D.   On: 07/28/2013 12:29     EKG Interpretation None      MDM   Final diagnoses:  Degenerative lumbar disc    Pt is neurovascularly intact. Will treat with hydrocodone for pain and have  Follow up as likely a chronic pain problem  I personally performed the services described in this documentation, which was scribed in my presence. The recorded information has been reviewed and is accurate.    Vanessa LowerVrinda Brynn Mulgrew, NP 07/28/13 [phone]

## 2013-07-28 NOTE — ED Provider Notes (Signed)
Medical screening examination/treatment/procedure(s) were performed by non-physician practitioner and as supervising physician I was immediately available for consultation/collaboration.   EKG Interpretation None        Kristen N Ward, DO 07/28/13 1241 

## 2013-07-28 NOTE — ED Notes (Signed)
Patient transported to X-ray 

## 2013-07-28 NOTE — ED Notes (Signed)
Pt states hx of back problems.  C/o low back cracking x 2 wks.  States that she has been seen several times in the ER for same but has never followed up.

## 2013-07-28 NOTE — Discharge Instructions (Signed)
You need to follow up for continued symptoms Degenerative Disk Disease Degenerative disk disease is a condition caused by the changes that occur in the cushions of the backbone (spinal disks) as you grow older. Spinal disks are soft and compressible disks located between the bones of the spine (vertebrae). They act like shock absorbers. Degenerative disk disease can affect the whole spine. However, the neck and lower back are most commonly affected. Many changes can occur in the spinal disks with aging, such as:  The spinal disks may dry and shrink.  Small tears may occur in the tough, outer covering of the disk (annulus).  The disk space may become smaller due to loss of water.  Abnormal growths in the bone (spurs) may occur. This can put pressure on the nerve roots exiting the spinal canal, causing pain.  The spinal canal may become narrowed. CAUSES  Degenerative disk disease is a condition caused by the changes that occur in the spinal disks with aging. The exact cause is not known, but there is a genetic basis for many patients. Degenerative changes can occur due to loss of fluid in the disk. This makes the disk thinner and reduces the space between the backbones. Small cracks can develop in the outer layer of the disk. This can lead to the breakdown of the disk. You are more likely to get degenerative disk disease if you are overweight. Smoking cigarettes and doing heavy work such as weightlifting can also increase your risk of this condition. Degenerative changes can start after a sudden injury. Growth of bone spurs can compress the nerve roots and cause pain.  SYMPTOMS  The symptoms vary from person to person. Some people may have no pain, while others have severe pain. The pain may be so severe that it can limit your activities. The location of the pain depends on the part of your backbone that is affected. You will have neck or arm pain if a disk in the neck area is affected. You will have  pain in your back, buttocks, or legs if a disk in the lower back is affected. The pain becomes worse while bending, reaching up, or with twisting movements. The pain may start gradually and then get worse as time passes. It may also start after a major or minor injury. You may feel numbness or tingling in the arms or legs.  DIAGNOSIS  Your caregiver will ask you about your symptoms and about activities or habits that may cause the pain. He or she may also ask about any injuries, diseases, or treatments you have had earlier. Your caregiver will examine you to check for the range of movement that is possible in the affected area, to check for strength in your extremities, and to check for sensation in the areas of the arms and legs supplied by different nerve roots. An X-ray of the spine may be taken. Your caregiver may suggest other imaging tests, such as magnetic resonance imaging (MRI), if needed.  TREATMENT  Treatment includes rest, modifying your activities, and applying ice and heat. Your caregiver may prescribe medicines to reduce your pain and may ask you to do some exercises to strengthen your back. In some cases, you may need surgery. You and your caregiver will decide on the treatment that is best for you. HOME CARE INSTRUCTIONS   Follow proper lifting and walking techniques as advised by your caregiver.  Maintain good posture.  Exercise regularly as advised.  Perform relaxation exercises.  Change your sitting,  standing, and sleeping habits as advised. Change positions frequently.  Lose weight as advised.  Stop smoking if you smoke.  Wear supportive footwear. SEEK MEDICAL CARE IF:  Your pain does not go away within 1 to 4 weeks. SEEK IMMEDIATE MEDICAL CARE IF:   Your pain is severe.  You notice weakness in your arms, hands, or legs.  You begin to lose control of your bladder or bowel movements. MAKE SURE YOU:   Understand these instructions.  Will watch your  condition.  Will get help right away if you are not doing well or get worse. Document Released: 01/20/2007 Document Revised: 06/17/2011 Document Reviewed: 01/20/2007 [facility] Patient Information 2014 [employer] [place].

## 2013-09-25 ENCOUNTER — Emergency Department (HOSPITAL_COMMUNITY)
Admission: EM | Admit: 2013-09-25 | Discharge: 2013-09-25 | Disposition: A | Discharge status: Home / Self Care | Payer: Medicaid Other | Attending: Emergency Medicine | Admitting: Emergency Medicine

## 2013-09-25 ENCOUNTER — Encounter (HOSPITAL_COMMUNITY): Payer: Self-pay | Admitting: Emergency Medicine

## 2013-09-25 DIAGNOSIS — H5789 Other specified disorders of eye and adnexa: Secondary | ICD-10-CM | POA: Insufficient documentation

## 2013-09-25 DIAGNOSIS — H5710 Ocular pain, unspecified eye: Secondary | ICD-10-CM | POA: Insufficient documentation

## 2013-09-25 DIAGNOSIS — H9190 Unspecified hearing loss, unspecified ear: Secondary | ICD-10-CM | POA: Insufficient documentation

## 2013-09-25 DIAGNOSIS — H5713 Ocular pain, bilateral: Secondary | ICD-10-CM

## 2013-09-25 DIAGNOSIS — H53149 Visual discomfort, unspecified: Secondary | ICD-10-CM | POA: Insufficient documentation

## 2013-09-25 MED ORDER — HYDROCODONE-ACETAMINOPHEN 5-325 MG PO TABS
1.0000 | ORAL_TABLET | Freq: Once | ORAL | Status: AC
  Administered 2013-09-25: 1 via ORAL
  Filled 2013-09-25: qty 1

## 2013-09-25 MED ORDER — HYDROCODONE-ACETAMINOPHEN 5-325 MG PO TABS: 1.0000 | ORAL_TABLET | Freq: Four times a day (QID) | ORAL | Status: DC | PRN

## 2013-09-25 MED ORDER — FLUORESCEIN SODIUM 1 MG OP STRP
1.0000 | ORAL_STRIP | Freq: Once | OPHTHALMIC | Status: AC
  Administered 2013-09-25: 1 via OPHTHALMIC
  Filled 2013-09-25: qty 1

## 2013-09-25 MED ORDER — TETRACAINE HCL 0.5 % OP SOLN
2.0000 [drp] | Freq: Once | OPHTHALMIC | Status: AC
  Administered 2013-09-25: 2 [drp] via OPHTHALMIC
  Filled 2013-09-25: qty 2

## 2013-09-25 NOTE — Discharge Instructions (Signed)
Please follow up closely with eye specialist on Monday for further care of your eye.  Return if your symptoms worsen or if you have other concerns.  Iritis Iritis is an inflammation of the colored part of the eye (January). Other parts at the front of the eye may also be inflamed. The Kelleigh is part of the middle layer of the eyeball which is called the uvea or the uveal track. Any part of the uveal track can become inflamed. The other portions of the uveal track are the choroid (the thin membrane under the outer layer of the eye), and the ciliary body (joins the choroid and the Moncia and produces the fluid in the front of the eye).  It is extremely important to treat iritis early, as it may lead to internal eye damage causing scarring or diseases such as glaucoma. Some people have only one attack of iritis (in one or both eyes) in their lifetime, while others may get it many times. CAUSES Iritis can be associated with many different diseases, but mostly occurs in otherwise healthy people. Examples of diseases that can be associated with iritis include:  Diseases where the body's immune system attacks tissues within your own body (autoimmune diseases).  Infections (tuberculosis, gonorrhea, fungus infections, Lyme disease, infection of the lining of the heart).  Trauma or injury.  Eye diseases (acute glaucoma and others).  Inflammation from other parts of the uveal track.  Severe eye infections.  Other rare diseases. SYMPTOMS  Eye pain or aching.  Sensitivity to light.  Loss of sight or blurred vision.  Redness of the eye. This is often accompanied by a ring of redness around the outside of the cornea, or clear covering at the front of the eye (ciliary flush).  Excessive tearing of the eye(s).  A small pupil that does not enlarge in the dark and stays smaller than the other eye's pupil.  A whitish area that obscures the lower part of the colored circular Bryttney. Sometimes this is visible  when looking at the eye, where the whitish area has a "fluid level" or flat top. This is called a "hypopyon" and is actually pus inside the eye. Since iritis causes the eye to become red, it is often confused with a much less dangerous form of "pink eye" or conjunctivitis. One of the most important symptoms is sensitivity to light. Anytime there is redness, discomfort in the eye(s) and extreme light sensitivity, it is extremely important to see an ophthalmologist as soon as possible. TREATMENT Acute iritis requires prompt medical evaluation by an eye specialist (ophthalmologist.) Treatment depends on the underlying cause but may include:  Corticosteroid eye drops and dilating eye drops. Follow your caregiver's exact instructions on taking and stopping corticosteroid medications (drops or pills).  Occasionally, the iritis will be so severe that it will not respond to commonly used medications. If this happens, it may be necessary to use steroid injections. The injections are given under the eye's outer surface. Sometimes oral medications are given. The decision on treatment used for iritis is usually made on an individual basis. HOME CARE INSTRUCTIONS Your care giver will give specific instructions regarding the use of eye medications or other medications. Be certain to follow all instructions in both taking and stopping the medications. SEEK IMMEDIATE MEDICAL CARE IF:  You have redness of one or both eye.  You experience a great deal of light sensitivity.  You have pain or aching in either eye. MAKE SURE YOU:   Understand these  instructions.  Will watch your condition.  Will get help right away if you are not doing well or get worse. Document Released: 03/25/2005 Document Revised: 06/17/2011 Document Reviewed: 09/12/2006 [facility] Patient Information 2015 [place], [place]. This information is not intended to replace advice given to you by your health care provider. Make sure you discuss any  questions you have with your health care provider.

## 2013-09-25 NOTE — ED Provider Notes (Signed)
CSN: [id number]     Arrival date & time 09/25/13  1509 History  This chart was scribed for non-physician practitioner [name], PA-C, working with Audree CamelScott T Goldston, MD, by [facility] Scribe. This patient was seen in room WTR6/WTR6 and the patient's care was started at 3:57 PM.      Chief Complaint  Patient presents with  . Eye Pain   The history is provided by the patient. No language interpreter was used.  HPI Comments: Vanessa Walsh is a 38 y.o. female who presents to the Emergency Department complaining of stabbing eye pain in both eyes onset this morning when she woke up. Patient reports that the pain hurts from the back of her eyes with no associated headache. Patient states that her eyes were fine going to sleep last night. Patient reports that both eyes were tearful and that she has had some hearing difficulty. Patient reports photophobia and pain when exposed to light.  Patient does not wear contact lenses. Patient has not been checked at an optometrist for this problem. Patient denies neck pain.    History reviewed. No pertinent past medical history. Past Surgical History  Procedure Laterality Date  . Ovarian cyst surgery     History reviewed. No pertinent family history. History  Substance Use Topics  . Smoking status: Current Every Day Smoker -- 1.00 packs/day    Types: Cigarettes  . Smokeless tobacco: Not on file  . Alcohol Use: No   OB History   Grav Para Term Preterm Abortions TAB SAB Ect Mult Living                 Review of Systems  HENT: Positive for hearing loss.   Eyes: Positive for photophobia, pain and discharge (tearful).  Musculoskeletal: Negative for neck pain.  Neurological: Negative for headaches.      Allergies  Tramadol  Home Medications   Prior to Admission medications   Medication Sig Start Date End Date Taking? Authorizing Provider  HYDROcodone-acetaminophen (NORCO/VICODIN) 5-325 MG per tablet Take 1-2 tablets by mouth every 6 (six)  hours as needed. 07/28/13   Teressa LowerVrinda Pickering, NP  ibuprofen (ADVIL,MOTRIN) 800 MG tablet Take 800 mg by mouth every 8 (eight) hours as needed for moderate pain.    Historical Provider, MD   Triage Vitals: BP 104/60  Pulse 66  Temp(Src) 98.8 F (37.1 C) (Oral)  Resp 16  SpO2 100%  LMP 09/25/2013 Physical Exam  Nursing note and vitals reviewed. Constitutional: She is oriented to person, place, and time. She appears well-developed and well-nourished.  HENT:  Head: Normocephalic and atraumatic.  Eyes: Conjunctivae and EOM are normal. Pupils are equal, round, and reactive to light.  Bilateral eyes:  Lids are inverted and no foreign object noted no conjunctival erthyema No chemosis No hyphema No fluorescein uptake Negative seidel's sign  Neck: Normal range of motion. No tracheal deviation present.  Cardiovascular: Normal rate.   Pulmonary/Chest: Effort normal.  Abdominal: She exhibits no distension.  Musculoskeletal: Normal range of motion.  Neurological: She is alert and oriented to person, place, and time.  Skin: Skin is warm and dry.  Psychiatric: She has a normal mood and affect. Her behavior is normal.    ED Course  Procedures (including critical care time) DIAGNOSTIC STUDIES: Oxygen Saturation is 100% on room air, normal by my interpretation.    COORDINATION OF CARE: 4:00 PM- Was given eye drops and medication to relieve the pain and referral to eye specialist. Pt advised of  plan for treatment and pt agrees.  4:30 PM Patient reports pain to both eyes worsening with light. On examination there is no obvious abnormalities aside from light sensitivity.  No discharge to suggest infection.  No redness to limbic region to suggest iritis or uveitis, however cannot completely rule out. No hx of autoimmune disease.  No constricting pupil to suggest acute angle glaucoma.  When pt open eye normally, vision is fine, but she was squinting for the most part.  Plan to give pain meds,  recommend wearing sun glasses and to f/u with ophthalmologist on Monday for further care.  Doubt acute emergent eye condition.    Pt request to leave before her ride left.  Unable to wait for ophalmology consult.  Recommend prompt f/u or return if worsen.  Pain medication prescribed.   Labs Review Labs Reviewed - No data to display  Imaging Review No results found.   EKG Interpretation None      MDM   Final diagnoses:  Eye pain, bilateral    BP 104/60  Pulse 66  Temp(Src) 98.8 F (37.1 C) (Oral)  Resp 16  SpO2 100%  LMP 09/25/2013   I personally performed the services described in this documentation, which was scribed in my presence. The recorded information has been reviewed and is accurate.     [name], PA-C 09/25/13 1643

## 2013-09-25 NOTE — ED Provider Notes (Signed)
Medical screening examination/treatment/procedure(s) were performed by non-physician practitioner and as supervising physician I was immediately available for consultation/collaboration.   EKG Interpretation None        Audree CamelScott T Goldston, MD 09/25/13 1929

## 2013-09-25 NOTE — ED Notes (Signed)
Pt c/o sharp stabbing pain bilateral eyes. States work with the pain and "waterly" eyes.

## 2013-12-04 ENCOUNTER — Emergency Department (HOSPITAL_COMMUNITY)
Admission: EM | Admit: 2013-12-04 | Discharge: 2013-12-04 | Disposition: A | Discharge status: Home / Self Care | Payer: Medicaid Other | Attending: Emergency Medicine | Admitting: Emergency Medicine

## 2013-12-04 ENCOUNTER — Emergency Department (HOSPITAL_COMMUNITY): Payer: Medicaid Other

## 2013-12-04 ENCOUNTER — Encounter (HOSPITAL_COMMUNITY): Payer: Self-pay | Admitting: Emergency Medicine

## 2013-12-04 DIAGNOSIS — G43909 Migraine, unspecified, not intractable, without status migrainosus: Secondary | ICD-10-CM | POA: Insufficient documentation

## 2013-12-04 DIAGNOSIS — H5710 Ocular pain, unspecified eye: Secondary | ICD-10-CM | POA: Insufficient documentation

## 2013-12-04 DIAGNOSIS — F17200 Nicotine dependence, unspecified, uncomplicated: Secondary | ICD-10-CM | POA: Diagnosis not present

## 2013-12-04 DIAGNOSIS — G4489 Other headache syndrome: Secondary | ICD-10-CM

## 2013-12-04 MED ORDER — BUTALBITAL-APAP-CAFFEINE 50-325-40 MG PO TABS: 1.0000 | ORAL_TABLET | Freq: Four times a day (QID) | ORAL | Status: DC | PRN

## 2013-12-04 MED ORDER — METOCLOPRAMIDE HCL 5 MG/ML IJ SOLN
10.0000 mg | Freq: Once | INTRAMUSCULAR | Status: AC
  Administered 2013-12-04: 10 mg via INTRAMUSCULAR
  Filled 2013-12-04: qty 2

## 2013-12-04 MED ORDER — DIPHENHYDRAMINE HCL 50 MG/ML IJ SOLN
25.0000 mg | Freq: Once | INTRAMUSCULAR | Status: AC
  Administered 2013-12-04: 25 mg via INTRAMUSCULAR
  Filled 2013-12-04: qty 1

## 2013-12-04 MED ORDER — KETOROLAC TROMETHAMINE 60 MG/2ML IM SOLN
60.0000 mg | Freq: Once | INTRAMUSCULAR | Status: DC
  Filled 2013-12-04: qty 2

## 2013-12-04 NOTE — ED Notes (Addendum)
Pt reports stabbing eye pain since yesterday. Hx similar migraines every other month. C/o light and sound sensitivity. Intermittent dizziness. Took advil 3 hrs PTA, reports no relief. Denies n/v.

## 2013-12-04 NOTE — ED Provider Notes (Signed)
CSN: [id number]     Arrival date & time 12/04/13  1348 History   First MD Initiated Contact with Patient 12/04/13 1537     Chief Complaint  Patient presents with  . Migraine  . Eye Pain     (Consider location/radiation/quality/duration/timing/severity/associated sxs/prior Treatment) HPI Comments: Patient here complaining of right-sided eye pain since yesterday. History of migraines and this is similar. Notes photophobia as well as phonophobia. Denies any fever or chills. No neck pain. No vision loss in her eye. Symptoms are worse at night. Has used NSAIDs without relief. Denies any gait disturbance. No recent history of head trauma. She has never had intracranial imaging. Denies any recent URI symptoms. No unilateral weakness.  Patient is a 38 y.o. female presenting with migraines and eye pain. The history is provided by the patient.  Migraine  Eye Pain    History reviewed. No pertinent past medical history. Past Surgical History  Procedure Laterality Date  . Ovarian cyst surgery     No family history on file. History  Substance Use Topics  . Smoking status: Current Every Day Smoker -- 1.00 packs/day    Types: Cigarettes  . Smokeless tobacco: Not on file  . Alcohol Use: No   OB History   Grav Para Term Preterm Abortions TAB SAB Ect Mult Living                 Review of Systems  Eyes: Positive for pain.  All other systems reviewed and are negative.     Allergies  Tramadol  Home Medications   Prior to Admission medications   Medication Sig Start Date End Date Taking? Authorizing Provider  HYDROcodone-acetaminophen (NORCO/VICODIN) 5-325 MG per tablet Take 1-2 tablets by mouth every 6 (six) hours as needed for severe pain. 09/25/13   Fayrene Helper, PA-C  ibuprofen (ADVIL,MOTRIN) 800 MG tablet Take 800 mg by mouth every 8 (eight) hours as needed for moderate pain.    Historical Provider, MD   BP 99/66  Pulse 63  Temp(Src) 98.6 F (37 C) (Oral)  Resp 14  SpO2 100%   LMP 11/03/2013 Physical Exam  Nursing note and vitals reviewed. Constitutional: She is oriented to person, place, and time. She appears well-developed and well-nourished.  Non-toxic appearance. No distress.  HENT:  Head: Normocephalic and atraumatic.  Eyes: Conjunctivae, EOM and lids are normal. Pupils are equal, round, and reactive to light.  Neck: Normal range of motion. Neck supple. No tracheal deviation present. No mass present.  Cardiovascular: Normal rate, regular rhythm and normal heart sounds.  Exam reveals no gallop.   No murmur heard. Pulmonary/Chest: Effort normal and breath sounds normal. No stridor. No respiratory distress. She has no decreased breath sounds. She has no wheezes. She has no rhonchi. She has no rales.  Abdominal: Soft. Normal appearance and bowel sounds are normal. She exhibits no distension. There is no tenderness. There is no rebound and no CVA tenderness.  Musculoskeletal: Normal range of motion. She exhibits no edema and no tenderness.  Neurological: She is alert and oriented to person, place, and time. She has normal strength. No cranial nerve deficit or sensory deficit. Coordination and gait normal. GCS eye subscore is 4. GCS verbal subscore is 5. GCS motor subscore is 6.  Skin: Skin is warm and dry. No abrasion and no rash noted.  Psychiatric: She has a normal mood and affect. Her speech is normal and behavior is normal.    ED Course  Procedures (including critical care time) Labs  Review Labs Reviewed - No data to display  Imaging Review No results found.   EKG Interpretation None      MDM   Final diagnoses:  None    Patient given migraine feels better. CT of the head is negative. Stable for discharge    Toy Baker, MD 12/04/13 [phone]

## 2013-12-04 NOTE — ED Notes (Signed)
She c/o occasional frontal h/a x 3 months.  She states "this one is real bad".  She denies any fever/vomiting/nor any other sign of current illness.  She denies any visual disturbances.

## 2013-12-04 NOTE — Discharge Instructions (Signed)

## 2014-02-01 ENCOUNTER — Encounter: Payer: Self-pay | Admitting: Internal Medicine

## 2014-02-01 ENCOUNTER — Ambulatory Visit: Payer: Medicaid Other | Attending: Internal Medicine | Admitting: Internal Medicine

## 2014-02-01 VITALS — BP 100/64 | HR 71 | Temp 98.1°F | Ht 65.0 in | Wt 185.4 lb

## 2014-02-01 DIAGNOSIS — G8929 Other chronic pain: Secondary | ICD-10-CM | POA: Diagnosis not present

## 2014-02-01 DIAGNOSIS — F17210 Nicotine dependence, cigarettes, uncomplicated: Secondary | ICD-10-CM | POA: Diagnosis not present

## 2014-02-01 DIAGNOSIS — M545 Low back pain: Secondary | ICD-10-CM | POA: Diagnosis present

## 2014-02-01 DIAGNOSIS — M549 Dorsalgia, unspecified: Secondary | ICD-10-CM | POA: Diagnosis not present

## 2014-02-01 DIAGNOSIS — Z2821 Immunization not carried out because of patient refusal: Secondary | ICD-10-CM

## 2014-02-01 NOTE — Progress Notes (Signed)
Patient state that she is here to establish care and address her back pain. She has been to the ED multiple times due to her back pain which she states is due to her having "9" children all with epidurals. Back pain is chronic and has been since her first child. Back pain is 10/10 sharp and cramping. She is currently taking no medications for her pain because she states that "none of them help anyway's". She is a current smoker/ no alcohol. Patient is requesting records from other doctor office so we can see how "they did not help her"

## 2014-02-01 NOTE — Progress Notes (Signed)
Patient ID: Vanessa Walsh, female   DOB: 02-Dec-1975, 38 y.o.   MRN: [id number]  [id number]  [id number]  DOB - 02-Dec-1975  CC:  Chief Complaint  Patient presents with  . Establish Care       HPI: Vanessa Walsh is a 38 y.o. female here today to establish medical care.  Patient state that she is here to establish care and address her back pain. She has been to the ED multiple times due to her back pain which she states is due to her having "9" children all with epidurals. Back pain is chronic and has been since her first child. Back pain is 10/10 sharp and cramping. She is currently taking no medications for her pain because she states that "none of them help anyway's". She is a current smoker/ no alcohol. Patient is requesting records from other doctor office so we can see how "they did not help her".  Patient reports that she has never been to a specialist. She states that her last provider just gave her pain pills. She has had several imagining studies that have all came back negative.     Allergies  Allergen Reactions  . Tramadol     hives   No past medical history on file. No current outpatient prescriptions on file prior to visit.   No current facility-administered medications on file prior to visit.   No family history on file. History   Social History  . Marital Status: Single    Spouse Name: N/A    Number of Children: N/A  . Years of Education: N/A   Occupational History  . Not on file.   Social History Main Topics  . Smoking status: Current Every Day Smoker -- 1.00 packs/day    Types: Cigarettes  . Smokeless tobacco: Not on file  . Alcohol Use: No  . Drug Use: No  . Sexual Activity: Yes    Birth Control/ Protection: None   Other Topics Concern  . Not on file   Social History Narrative  . No narrative on file    Review of Systems  Gastrointestinal: Negative.   Genitourinary: Negative.   Musculoskeletal: Positive for neck pain.  All other systems  reviewed and are negative.     Objective:   Filed Vitals:   02/01/14 1529  BP: 100/64  Pulse: 71  Temp: 98.1 F (36.7 C)    Physical Exam: Constitutional: Patient appears well-developed and well-nourished. No distress. HENT: Normocephalic, atraumatic, External right and left ear normal. Oropharynx is clear and moist.  Eyes: Conjunctivae and EOM are normal. PERRLA, no scleral icterus. Neck: Normal ROM. Neck supple. No JVD. No tracheal deviation. No thyromegaly. CVS: RRR, S1/S2 +, no murmurs, no gallops, no carotid bruit.  Pulmonary: Effort and breath sounds normal, no stridor, rhonchi, wheezes, rales.  Abdominal: Soft. BS +, no distension, tenderness, rebound or guarding.  Musculoskeletal: Normal range of motion. No edema. Positive tenderness to palpitation of lower lumbar spine  Lymphadenopathy: No lymphadenopathy noted, cervical, Neuro: Alert. Normal reflexes, muscle tone coordination. No cranial nerve deficit. Skin: Skin is warm and dry. No rash noted. Not diaphoretic. No erythema. No pallor. Psychiatric: Normal mood and affect. Behavior, judgment, thought content normal.  Lab Results  Component Value Date   WBC 10.3 12/10/2012   HGB 13.1 12/10/2012   HCT 37.9 12/10/2012   MCV 99.0 12/10/2012   PLT 160 12/10/2012   Lab Results  Component Value Date   CREATININE 0.88 12/10/2012   BUN 5* 12/10/2012  NA 137 12/10/2012   K 3.9 12/10/2012   CL 99 12/10/2012   CO2 28 12/10/2012    No results found for this basename: HGBA1C   Lipid Panel  No results found for this basename: chol, trig, hdl, cholhdl, vldl, ldlcalc       Assessment and plan:   Abagail was seen today for establish care.  Diagnoses and associated orders for this visit:  Chronic back pain - AMB referral to orthopedics Patient may continue to use Flexeril and ibuprofen at home Refused influenza vaccine     Return if symptoms worsen or fail to improve.  The patient was given clear instructions to go to ER or return  to medical center if symptoms don't improve, worsen or new problems develop. The patient verbalized understanding.   Holland CommonsKECK, Loella Hickle, NP-C [facility] [phone] 02/01/2014, 3:40 PM

## 2014-02-01 NOTE — Patient Instructions (Signed)
Back Pain, Adult Low back pain is very common. About 1 in 5 people have back pain.The cause of low back pain is rarely dangerous. The pain often gets better over time.About half of people with a sudden onset of back pain feel better in just 2 weeks. About 8 in 10 people feel better by 6 weeks.  CAUSES Some common causes of back pain include:  Strain of the muscles or ligaments supporting the spine.  Wear and tear (degeneration) of the spinal discs.  Arthritis.  Direct injury to the back. DIAGNOSIS Most of the time, the direct cause of low back pain is not known.However, back pain can be treated effectively even when the exact cause of the pain is unknown.Answering your caregiver's questions about your overall health and symptoms is one of the most accurate ways to make sure the cause of your pain is not dangerous. If your caregiver needs more information, he or she may order lab work or imaging tests (X-rays or MRIs).However, even if imaging tests show changes in your back, this usually does not require surgery. HOME CARE INSTRUCTIONS For many people, back pain returns.Since low back pain is rarely dangerous, it is often a condition that people can learn to manageon their own.   Remain active. It is stressful on the back to sit or stand in one place. Do not sit, drive, or stand in one place for more than 30 minutes at a time. Take short walks on level surfaces as soon as pain allows.Try to increase the length of time you walk each day.  Do not stay in bed.Resting more than 1 or 2 days can delay your recovery.  Do not avoid exercise or work.Your body is made to move.It is not dangerous to be active, even though your back may hurt.Your back will likely heal faster if you return to being active before your pain is gone.  Pay attention to your body when you bend and lift. Many people have less discomfortwhen lifting if they bend their knees, keep the load close to their bodies,and  avoid twisting. Often, the most comfortable positions are those that put less stress on your recovering back.  Find a comfortable position to sleep. Use a firm mattress and lie on your side with your knees slightly bent. If you lie on your back, put a pillow under your knees.  Only take over-the-counter or prescription medicines as directed by your caregiver. Over-the-counter medicines to reduce pain and inflammation are often the most helpful.Your caregiver may prescribe muscle relaxant drugs.These medicines help dull your pain so you can more quickly return to your normal activities and healthy exercise.  Put ice on the injured area.  Put ice in a plastic bag.  Place a towel between your skin and the bag.  Leave the ice on for 15-20 minutes, 03-04 times a day for the first 2 to 3 days. After that, ice and heat may be alternated to reduce pain and spasms.  Ask your caregiver about trying back exercises and gentle massage. This may be of some benefit.  Avoid feeling anxious or stressed.Stress increases muscle tension and can worsen back pain.It is important to recognize when you are anxious or stressed and learn ways to manage it.Exercise is a great option. SEEK MEDICAL CARE IF:  You have pain that is not relieved with rest or medicine.  You have pain that does not improve in 1 week.  You have new symptoms.  You are generally not feeling well. SEEK   IMMEDIATE MEDICAL CARE IF:   You have pain that radiates from your back into your legs.  You develop new bowel or bladder control problems.  You have unusual weakness or numbness in your arms or legs.  You develop nausea or vomiting.  You develop abdominal pain.  You feel faint. Document Released: 03/25/2005 Document Revised: 09/24/2011 Document Reviewed: 07/27/2013 [employer] Patient Information 2015 [employer]. This information is not intended to replace advice given to you by your health care provider. Make sure you  discuss any questions you have with your health care provider.  

## 2014-06-03 ENCOUNTER — Encounter (HOSPITAL_COMMUNITY): Payer: Self-pay

## 2014-06-03 ENCOUNTER — Emergency Department (HOSPITAL_COMMUNITY)
Admission: EM | Admit: 2014-06-03 | Discharge: 2014-06-03 | Disposition: A | Discharge status: Home / Self Care | Payer: Medicaid Other | Attending: Emergency Medicine | Admitting: Emergency Medicine

## 2014-06-03 DIAGNOSIS — Y9389 Activity, other specified: Secondary | ICD-10-CM | POA: Insufficient documentation

## 2014-06-03 DIAGNOSIS — Y998 Other external cause status: Secondary | ICD-10-CM | POA: Diagnosis not present

## 2014-06-03 DIAGNOSIS — S01511A Laceration without foreign body of lip, initial encounter: Secondary | ICD-10-CM | POA: Insufficient documentation

## 2014-06-03 DIAGNOSIS — Y9289 Other specified places as the place of occurrence of the external cause: Secondary | ICD-10-CM | POA: Insufficient documentation

## 2014-06-03 DIAGNOSIS — S00501A Unspecified superficial injury of lip, initial encounter: Secondary | ICD-10-CM | POA: Diagnosis present

## 2014-06-03 DIAGNOSIS — Z720 Tobacco use: Secondary | ICD-10-CM | POA: Diagnosis not present

## 2014-06-03 DIAGNOSIS — S0182XA Laceration with foreign body of other part of head, initial encounter: Secondary | ICD-10-CM | POA: Diagnosis not present

## 2014-06-03 MED ORDER — HYDROCODONE-ACETAMINOPHEN 5-325 MG PO TABS
1.0000 | ORAL_TABLET | ORAL | Status: DC | PRN
Start: 2014-06-03 — End: 2016-08-06

## 2014-06-03 MED ORDER — LIDOCAINE-EPINEPHRINE 2 %-1:100000 IJ SOLN
20.0000 mL | Freq: Once | INTRAMUSCULAR | Status: DC
  Filled 2014-06-03: qty 1

## 2014-06-03 MED ORDER — CEPHALEXIN 500 MG PO CAPS: 500.0000 mg | ORAL_CAPSULE | Freq: Four times a day (QID) | ORAL | Status: DC

## 2014-06-03 MED ORDER — CEPHALEXIN 500 MG PO CAPS
500.0000 mg | ORAL_CAPSULE | Freq: Once | ORAL | Status: DC
  Filled 2014-06-03: qty 1
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
## 2014-06-03 NOTE — Discharge Instructions (Signed)
Facial Laceration °A facial laceration is a cut on the face. These injuries can be painful and cause bleeding. Some cuts may need to be closed with stitches (sutures), skin adhesive strips, or wound glue. Cuts usually heal quickly but can leave a scar. It can take 1-2 years for the scar to go away completely. °HOME CARE  °· Only take medicines as told by your doctor. °· Follow your doctor's instructions for wound care. °For Stitches: °· Keep the cut clean and dry. °· If you have a bandage (dressing), change it at least once a day. Change the bandage if it gets wet or dirty, or as told by your doctor. °· Wash the cut with soap and water 2 times a day. Rinse the cut with water. Pat it dry with a clean towel. °· Put a thin layer of medicated cream on the cut as told by your doctor. °· You may shower after the first 24 hours. Do not soak the cut in water until the stitches are removed. °· Have your stitches removed as told by your doctor. °· Do not wear any makeup until a few days after your stitches are removed. °For Skin Adhesive Strips: °· Keep the cut clean and dry. °· Do not get the strips wet. You may take a bath, but be careful to keep the cut dry. °· If the cut gets wet, pat it dry with a clean towel. °· The strips will fall off on their own. Do not remove the strips that are still stuck to the cut. °For Wound Glue: °· You may shower or take baths. Do not soak or scrub the cut. Do not swim. Avoid heavy sweating until the glue falls off on its own. After a shower or bath, pat the cut dry with a clean towel. °· Do not put medicine or makeup on your cut until the glue falls off. °· If you have a bandage, do not put tape over the glue. °· Avoid lots of sunlight or tanning lamps until the glue falls off. °· The glue will fall off on its own in 5-10 days. Do not pick at the glue. °After Healing: °Put sunscreen on the cut for the first year to reduce your scar. °GET HELP RIGHT AWAY IF:  °· Your cut area gets red,  painful, or puffy (swollen). °· You see a yellowish-white fluid (pus) coming from the cut. °· You have chills or a fever. °MAKE SURE YOU:  °· Understand these instructions. °· Will watch your condition. °· Will get help right away if you are not doing well or get worse. °Document Released: 09/11/2007 Document Revised: 01/13/2013 Document Reviewed: 11/05/2012 °ExitCare® Patient Information ©2015 [employer]. This information is not intended to replace advice given to you by your health care provider. Make sure you discuss any questions you have with your health care provider. °Laceration Care, Adult °A laceration is a cut or lesion that goes through all layers of the skin and into the tissue just beneath the skin. °TREATMENT  °Some lacerations may not require closure. Some lacerations may not be able to be closed due to an increased risk of infection. It is important to see your caregiver as soon as possible after an injury to minimize the risk of infection and maximize the opportunity for successful closure. °If closure is appropriate, pain medicines may be given, if needed. The wound will be cleaned to help prevent infection. Your caregiver will use stitches (sutures), staples, wound glue (adhesive), or skin adhesive   strips to repair the laceration. These tools bring the skin edges together to allow for faster healing and a better cosmetic outcome. However, all wounds will heal with a scar. Once the wound has healed, scarring can be minimized by covering the wound with sunscreen during the day for 1 full year. °HOME CARE INSTRUCTIONS  °For sutures or staples: °· Keep the wound clean and dry. °· If you were given a bandage (dressing), you should change it at least once a day. Also, change the dressing if it becomes wet or dirty, or as directed by your caregiver. °· Wash the wound with soap and water 2 times a day. Rinse the wound off with water to remove all soap. Pat the wound dry with a clean towel. °· After  cleaning, apply a thin layer of the antibiotic ointment as recommended by your caregiver. This will help prevent infection and keep the dressing from sticking. °· You may shower as usual after the first 24 hours. Do not soak the wound in water until the sutures are removed. °· Only take over-the-counter or prescription medicines for pain, discomfort, or fever as directed by your caregiver. °· Get your sutures or staples removed as directed by your caregiver. °For skin adhesive strips: °· Keep the wound clean and dry. °· Do not get the skin adhesive strips wet. You may bathe carefully, using caution to keep the wound dry. °· If the wound gets wet, pat it dry with a clean towel. °· Skin adhesive strips will fall off on their own. You may trim the strips as the wound heals. Do not remove skin adhesive strips that are still stuck to the wound. They will fall off in time. °For wound adhesive: °· You may briefly wet your wound in the shower or bath. Do not soak or scrub the wound. Do not swim. Avoid periods of heavy perspiration until the skin adhesive has fallen off on its own. After showering or bathing, gently pat the wound dry with a clean towel. °· Do not apply liquid medicine, cream medicine, or ointment medicine to your wound while the skin adhesive is in place. This may loosen the film before your wound is healed. °· If a dressing is placed over the wound, be careful not to apply tape directly over the skin adhesive. This may cause the adhesive to be pulled off before the wound is healed. °· Avoid prolonged exposure to sunlight or tanning lamps while the skin adhesive is in place. Exposure to ultraviolet light in the first year will darken the scar. °· The skin adhesive will usually remain in place for 5 to 10 days, then naturally fall off the skin. Do not pick at the adhesive film. °You may need a tetanus shot if: °· You cannot remember when you had your last tetanus shot. °· You have never had a tetanus  shot. °If you get a tetanus shot, your arm may swell, get red, and feel warm to the touch. This is common and not a problem. If you need a tetanus shot and you choose not to have one, there is a rare chance of getting tetanus. Sickness from tetanus can be serious. °SEEK MEDICAL CARE IF:  °· You have redness, swelling, or increasing pain in the wound. °· You see a red line that goes away from the wound. °· You have yellowish-white fluid (pus) coming from the wound. °· You have a fever. °· You notice a bad smell coming from the wound or dressing. °·   Your wound breaks open before or after sutures have been removed. °· You notice something coming out of the wound such as wood or glass. °· Your wound is on your hand or foot and you cannot move a finger or toe. °SEEK IMMEDIATE MEDICAL CARE IF:  °· Your pain is not controlled with prescribed medicine. °· You have severe swelling around the wound causing pain and numbness or a change in color in your arm, hand, leg, or foot. °· Your wound splits open and starts bleeding. °· You have worsening numbness, weakness, or loss of function of any joint around or beyond the wound. °· You develop painful lumps near the wound or on the skin anywhere on your body. °MAKE SURE YOU:  °· Understand these instructions. °· Will watch your condition. °· Will get help right away if you are not doing well or get worse. °Document Released: 03/25/2005 Document Revised: 06/17/2011 Document Reviewed: 09/18/2010 °ExitCare® Patient Information ©2015 [employer]. This information is not intended to replace advice given to you by your health care provider. Make sure you discuss any questions you have with your health care provider. ° °

## 2014-06-03 NOTE — ED Notes (Signed)
Pt not in room to receive Keflex or d/c instructions.

## 2014-06-06 ENCOUNTER — Emergency Department (HOSPITAL_COMMUNITY)
Admission: EM | Admit: 2014-06-06 | Discharge: 2014-06-06 | Discharge status: Against Medical Advice | Payer: Medicaid Other | Attending: Emergency Medicine | Admitting: Emergency Medicine

## 2014-06-06 DIAGNOSIS — R0602 Shortness of breath: Secondary | ICD-10-CM | POA: Insufficient documentation

## 2014-06-06 DIAGNOSIS — F419 Anxiety disorder, unspecified: Secondary | ICD-10-CM | POA: Diagnosis not present

## 2014-06-06 DIAGNOSIS — R1310 Dysphagia, unspecified: Secondary | ICD-10-CM | POA: Insufficient documentation

## 2014-06-06 DIAGNOSIS — Z720 Tobacco use: Secondary | ICD-10-CM | POA: Diagnosis not present

## 2014-06-06 DIAGNOSIS — R002 Palpitations: Secondary | ICD-10-CM | POA: Insufficient documentation

## 2014-06-06 DIAGNOSIS — R55 Syncope and collapse: Secondary | ICD-10-CM | POA: Insufficient documentation

## 2014-06-06 LAB — CBC WITH DIFFERENTIAL/PLATELET
BASOS PCT: 0 % (ref 0–1)
Basophils Absolute: 0 10*3/uL (ref 0.0–0.1)
Eosinophils Absolute: 0.1 10*3/uL (ref 0.0–0.7)
Eosinophils Relative: 1 % (ref 0–5)
HEMATOCRIT: 39.4 % (ref 36.0–46.0)
Hemoglobin: 13.2 g/dL (ref 12.0–15.0)
Lymphocytes Relative: 24 % (ref 12–46)
Lymphs Abs: 2.3 10*3/uL (ref 0.7–4.0)
MCH: 31.9 pg (ref 26.0–34.0)
MCHC: 33.5 g/dL (ref 30.0–36.0)
MCV: 95.2 fL (ref 78.0–100.0)
MONO ABS: 0.7 10*3/uL (ref 0.1–1.0)
Monocytes Relative: 8 % (ref 3–12)
Neutro Abs: 6.2 10*3/uL (ref 1.7–7.7)
Neutrophils Relative %: 67 % (ref 43–77)
Platelets: 225 10*3/uL (ref 150–400)
RBC: 4.14 MIL/uL (ref 3.87–5.11)
RDW: 13.5 % (ref 11.5–15.5)
WBC: 9.3 10*3/uL (ref 4.0–10.5)

## 2014-06-06 LAB — BASIC METABOLIC PANEL
Anion gap: 7 (ref 5–15)
CHLORIDE: 99 mmol/L (ref 96–112)
CO2: 28 mmol/L (ref 19–32)
CREATININE: 0.77 mg/dL (ref 0.50–1.10)
Calcium: 9.2 mg/dL (ref 8.4–10.5)
GFR calc Af Amer: >90 mL/min (ref >90)
GFR calc non Af Amer: >90 mL/min (ref >90)
Glucose, Bld: 111 mg/dL — ABNORMAL HIGH (ref 70–99)
Potassium: 4 mmol/L (ref 3.5–5.1)
Sodium: 134 mmol/L — ABNORMAL LOW (ref 135–145)

## 2014-06-06 NOTE — ED Notes (Signed)
Called over by patient significant other. Pt. Is hyperventilating. Coached on slowing breathing. Updated on room status.

## 2014-06-06 NOTE — ED Notes (Signed)
Pt reports shortness of breath today, syncopal episodes in car on way here. Pt very anxious, tearful in triage. Pt states she having trouble swallowing.

## 2014-06-06 NOTE — ED Notes (Signed)
Patient requesting to leave.  States she feels a lot better.  Encouraged her to stay but patient stated she was feeling better and was going to leave.  Reviewed the need to return, voiced understanding.  Patient stated she will return if she starts feeling worse. Resident aware

## 2014-06-06 NOTE — ED Provider Notes (Signed)
Went to see patient 8:33 PM  Pt not in room.  According to nurses pt left AMA prior to my arrival.  Per their report pt received 1L NS, and stated she was filling better before leaving.  AMA paperwork filled out by nurses.   Otis DialsWebb, Zynia Wojtowicz    Tianne Plott, MD 06/07/14 [phone]  Joya Gaskinsonald W Wickline, MD 06/08/14 [phone]

## 2014-06-06 NOTE — ED Notes (Signed)
Patient states it feels like she cannot breathe. She is conversing and resting comfortable per her statement with a pain 8/10. Stated that earlier today she had a ton of anxiety from her children and feels guilty for telling them she would like to just end things. She denies feeling that way now and states that she did not mean the comment when she said it. She denies every attempting to harm herself or having a plan currently to harm herself.
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
## 2014-06-09 ENCOUNTER — Emergency Department (HOSPITAL_COMMUNITY)
Admission: EM | Admit: 2014-06-09 | Discharge: 2014-06-09 | Disposition: A | Discharge status: Home / Self Care | Payer: Medicaid Other | Attending: Emergency Medicine | Admitting: Emergency Medicine

## 2014-06-09 ENCOUNTER — Encounter (HOSPITAL_COMMUNITY): Payer: Self-pay | Admitting: Nurse Practitioner

## 2014-06-09 DIAGNOSIS — Z720 Tobacco use: Secondary | ICD-10-CM | POA: Insufficient documentation

## 2014-06-09 DIAGNOSIS — Z4802 Encounter for removal of sutures: Secondary | ICD-10-CM | POA: Insufficient documentation

## 2014-06-09 DIAGNOSIS — Z792 Long term (current) use of antibiotics: Secondary | ICD-10-CM | POA: Diagnosis not present

## 2014-06-09 NOTE — ED Provider Notes (Signed)
CSN: [id number]     Arrival date & time 06/09/14  1609 History  This chart was scribed for non-physician practitioner Lawana ChambersWilliam Duncan Joliana Claflin working with No att. providers found by Carl Bestelina Holson, ED Scribe. This patient was seen in room WTR5/WTR5 and the patient's care was started at 4:25 PM.    Chief Complaint  Patient presents with  . Suture / Staple Removal   Patient is a 39 y.o. female presenting with suture removal. The history is provided by the patient. No language interpreter was used.  Suture / Staple Removal   HPI Comments: Vanessa Walsh is a 39 y.o. female who presents to the Emergency Department for suture removal.  She states that the area is healing well.  She denies fever, chills, discharge or rash as associated symptoms.   History reviewed. No pertinent past medical history. Past Surgical History  Procedure Laterality Date  . Ovarian cyst surgery     History reviewed. No pertinent family history. History  Substance Use Topics  . Smoking status: Current Every Day Smoker -- 1.00 packs/day    Types: Cigarettes  . Smokeless tobacco: Not on file  . Alcohol Use: No   OB History    No data available     Review of Systems  Constitutional: Negative for fever and chills.  Skin: Negative for rash.    Allergies  Tramadol  Home Medications   Prior to Admission medications   Medication Sig Start Date End Date Taking? Authorizing Provider  cephALEXin (KEFLEX) 500 MG capsule Take 1 capsule (500 mg total) by mouth 4 (four) times daily. Patient not taking: Reported on 06/06/2014 06/03/14   Dorthula Matasiffany G Greene, PA-C  HYDROcodone-acetaminophen (NORCO/VICODIN) 5-325 MG per tablet Take 1 tablet by mouth every 4 (four) hours as needed. Patient taking differently: Take 1 tablet by mouth every 4 (four) hours as needed for moderate pain.  06/03/14   Dorthula Matasiffany G Greene, PA-C   Triage Vitals: BP 108/72 mmHg  Pulse 100  Temp(Src) 98.4 F (36.9 C) (Oral)  Resp 14  SpO2 100%  Physical  Exam  Constitutional: She is oriented to person, place, and time. She appears well-developed and well-nourished. No distress.  HENT:  Head: Normocephalic and atraumatic.  Eyes: Right eye exhibits no discharge. Left eye exhibits no discharge.  Neck: Normal range of motion.  Cardiovascular: Normal rate.   Pulmonary/Chest: Effort normal. No respiratory distress.  Musculoskeletal: Normal range of motion.  Neurological: She is alert and oriented to person, place, and time. Coordination normal.  Skin: Skin is warm and dry. No rash noted. She is not diaphoretic. No erythema.  2 external sutures overlying a well-healed laceration to her left upper lip .  No surrounding edema, induration or erythema.  No discharge or bleeding.   Psychiatric: She has a normal mood and affect. Her behavior is normal.  Nursing note and vitals reviewed.   ED Course  SUTURE REMOVAL Date/Time: 06/09/2014 4:30 PM Performed by: Lawana ChambersANSIE, Aleeza [place] DUNCAN Authorized by: Lawana ChambersANSIE, Antonious Omahoney DUNCAN Consent: Verbal consent obtained. Risks and benefits: risks, benefits and alternatives were discussed Consent given by: patient Patient understanding: patient states understanding of the procedure being performed Patient consent: the patient's understanding of the procedure matches consent given Procedure consent: procedure consent matches procedure scheduled Relevant documents: relevant documents present and verified Site marked: the operative site was marked Required items: required blood products, implants, devices, and special equipment available Patient identity confirmed: verbally with patient Time out: Immediately prior to procedure a "time out" was  called to verify the correct patient, procedure, equipment, support staff and site/side marked as required. Body area: mouth Wound Appearance: clean Sutures Removed: 2 Post-removal: antibiotic ointment applied Facility: sutures placed in this facility Patient tolerance: Patient  tolerated the procedure well with no immediate complications   (including critical care time)  DIAGNOSTIC STUDIES: Oxygen Saturation is 100% on room air, normal by my interpretation.    COORDINATION OF CARE: 4:26 PM- Removed 2 external sutures on patient's upper lip.     Labs Review Labs Reviewed - No data to display  Imaging Review No results found.   EKG Interpretation None      Filed Vitals:   06/09/14 1618  BP: 108/72  Pulse: 100  Temp: 98.4 F (36.9 C)  TempSrc: Oral  Resp: 14  SpO2: 100%     MDM   Final diagnoses:  Visit for suture removal   Vanessa Walsh is a 39 y.o. female who presents to the Emergency Department for suture removal.  She states that the area is healing well.  She denies fever, chills, discharge or rash. The patient is afebrile and nontoxic appearing. Two external sutures were removed from her left upper lip. The laceration is well healed and well approximated. There is no surrounding erythema, edema or induration. No discharge noted. I advised the patient to follow-up with their primary care provider this week. I advised the patient to return to the emergency department with new or worsening symptoms or new concerns. The patient verbalized understanding and agreement with plan.   I personally performed the services described in this documentation, which was scribed in my presence. The recorded information has been reviewed and is accurate.     Lawana Chambers, PA-C 06/09/14 2057  Richardean Canal, MD 06/09/14 [phone]

## 2014-06-09 NOTE — ED Notes (Signed)
Pt wants he suture in upper lip removal.

## 2014-06-09 NOTE — Discharge Instructions (Signed)
Suture Removal, Care After °Refer to this sheet in the next few weeks. These instructions provide you with information on caring for yourself after your procedure. Your health care provider may also give you more specific instructions. Your treatment has been planned according to current medical practices, but problems sometimes occur. Call your health care provider if you have any problems or questions after your procedure. °WHAT TO EXPECT AFTER THE PROCEDURE °After your stitches (sutures) are removed, it is typical to have the following: °· Some discomfort and swelling in the wound area. °· Slight redness in the area. °HOME CARE INSTRUCTIONS  °· If you have skin adhesive strips over the wound area, do not take the strips off. They will fall off on their own in a few days. If the strips remain in place after 14 days, you may remove them. °· Change any bandages (dressings) at least once a day or as directed by your health care provider. If the bandage sticks, soak it off with warm, soapy water. °· Apply cream or ointment only as directed by your health care provider. If using cream or ointment, wash the area with soap and water 2 times a day to remove all the cream or ointment. Rinse off the soap and pat the area dry with a clean towel. °· Keep the wound area dry and clean. If the bandage becomes wet or dirty, or if it develops a bad smell, change it as soon as possible. °· Continue to protect the wound from injury. °· Use sunscreen when out in the sun. New scars become sunburned easily. °SEEK MEDICAL CARE IF: °· You have increasing redness, swelling, or pain in the wound. °· You see pus coming from the wound. °· You have a fever. °· You notice a bad smell coming from the wound or dressing. °· Your wound breaks open (edges not staying together). °Document Released: 12/18/2000 Document Revised: 01/13/2013 Document Reviewed: 11/04/2012 °ExitCare® Patient Information ©2015 [employer]. This information is not  intended to replace advice given to you by your health care provider. Make sure you discuss any questions you have with your health care provider. ° °Scar Minimization °You will have a scar anytime you have surgery and a cut is made in the skin or you have something removed from your skin (mole, skin cancer, cyst). Although scars are unavoidable following surgery, there are ways to minimize their appearance. °It is important to follow all the instructions you receive from your caregiver about wound care. How your wound heals will influence the appearance of your scar. If you do not follow the wound care instructions as directed, complications such as infection may occur. Wound instructions include keeping the wound clean, moist, and not letting the wound form a scab. Some people form scars that are raised and lumpy (hypertrophic) or larger than the initial wound (keloidal). °HOME CARE INSTRUCTIONS  °· Follow wound care instructions as directed. °· Keep the wound clean by washing it with soap and water. °· Keep the wound moist with provided antibiotic cream or petroleum jelly until completely healed. Moisten twice a day for about 2 weeks. °· Get stitches (sutures) taken out at the scheduled time. °· Avoid touching or manipulating your wound unless needed. Wash your hands thoroughly before and after touching your wound. °· Follow all restrictions such as limits on exercise or work. This depends on where your scar is located. °· Keep the scar protected from sunburn. Cover the scar with sunscreen/sunblock with SPF 30 or higher. °·   Gently massage the scar using a circular motion to help minimize the appearance of the scar. Do this only after the wound has closed and all the sutures have been removed. °· For hypertrophic or keloidal scars, there are several ways to treat and minimize their appearance. Methods include compression therapy, intralesional corticosteroids, laser therapy, or surgery. These methods are performed  by your caregiver. °Remember that the scar may appear lighter or darker than your normal skin color. This difference in color should even out with time. °SEEK MEDICAL CARE IF:  °· You have a fever. °· You develop signs of infection such as pain, redness, pus, and warmth. °· You have questions or concerns. °Document Released: 09/12/2009 Document Revised: 06/17/2011 Document Reviewed: 09/12/2009 °ExitCare® Patient Information ©2015 [employer]. This information is not intended to replace advice given to you by your health care provider. Make sure you discuss any questions you have with your health care provider. ° °

## 2014-07-29 ENCOUNTER — Encounter (HOSPITAL_COMMUNITY): Payer: Self-pay | Admitting: Emergency Medicine

## 2014-07-29 ENCOUNTER — Emergency Department (HOSPITAL_COMMUNITY)
Admission: EM | Admit: 2014-07-29 | Discharge: 2014-07-30 | Discharge status: Against Medical Advice | Payer: Medicaid Other | Attending: Emergency Medicine | Admitting: Emergency Medicine

## 2014-07-29 DIAGNOSIS — Z720 Tobacco use: Secondary | ICD-10-CM | POA: Diagnosis not present

## 2014-07-29 DIAGNOSIS — L02212 Cutaneous abscess of back [any part, except buttock]: Secondary | ICD-10-CM | POA: Diagnosis not present

## 2014-07-29 MED ORDER — LIDOCAINE-EPINEPHRINE (PF) 2 %-1:200000 IJ SOLN: 10.0000 mL | Freq: Once | INTRAMUSCULAR | Status: DC

## 2014-07-29 NOTE — ED Notes (Signed)
Pt called from lobby to be taken to room 13. Pt family member states she is outside.

## 2014-07-29 NOTE — ED Notes (Signed)
Pt c/o right posterior upper back abscess. Reports she's "had a black hole there forever kind of like a black head. My husband has to bust it every now and then. The last time he did it was about 5-6 months ago. The stuff that comes out of it be stankin'. It's like white and brown stuff." Denies N/V/D/F. No other c/c. C/o 10/10 pain. Area is reddened, hardened, and swollen. No drainage noted at present.

## 2014-07-30 ENCOUNTER — Encounter (HOSPITAL_COMMUNITY): Payer: Self-pay

## 2014-07-30 ENCOUNTER — Emergency Department (HOSPITAL_COMMUNITY)
Admission: EM | Admit: 2014-07-30 | Discharge: 2014-07-30 | Disposition: A | Discharge status: Home / Self Care | Payer: Medicaid Other | Attending: Emergency Medicine | Admitting: Emergency Medicine

## 2014-07-30 ENCOUNTER — Emergency Department (HOSPITAL_COMMUNITY)
Admission: EM | Admit: 2014-07-30 | Discharge: 2014-07-30 | Disposition: A | Discharge status: Home / Self Care | Payer: Medicaid Other

## 2014-07-30 DIAGNOSIS — L02212 Cutaneous abscess of back [any part, except buttock]: Secondary | ICD-10-CM | POA: Diagnosis present

## 2014-07-30 DIAGNOSIS — Z720 Tobacco use: Secondary | ICD-10-CM | POA: Diagnosis not present

## 2014-07-30 DIAGNOSIS — L0390 Cellulitis, unspecified: Secondary | ICD-10-CM

## 2014-07-30 DIAGNOSIS — L0291 Cutaneous abscess, unspecified: Secondary | ICD-10-CM

## 2014-07-30 DIAGNOSIS — L03312 Cellulitis of back [any part except buttock]: Secondary | ICD-10-CM | POA: Insufficient documentation

## 2014-07-30 MED ORDER — SULFAMETHOXAZOLE-TRIMETHOPRIM 800-160 MG PO TABS: 1.0000 | ORAL_TABLET | Freq: Two times a day (BID) | ORAL | Status: DC

## 2014-07-30 MED ORDER — OXYCODONE-ACETAMINOPHEN 5-325 MG PO TABS
1.0000 | ORAL_TABLET | Freq: Once | ORAL | Status: AC
  Administered 2014-07-30: 1 via ORAL
  Filled 2014-07-30: qty 1

## 2014-07-30 MED ORDER — NAPROXEN 500 MG PO TABS: 500.0000 mg | ORAL_TABLET | Freq: Two times a day (BID) | ORAL | Status: DC | PRN

## 2014-07-30 MED ORDER — OXYCODONE-ACETAMINOPHEN 5-325 MG PO TABS
1.0000 | ORAL_TABLET | Freq: Four times a day (QID) | ORAL | Status: DC | PRN
Start: 2014-07-30 — End: 2015-08-15

## 2014-07-30 MED ORDER — CEPHALEXIN 500 MG PO CAPS: ORAL_CAPSULE | ORAL | Status: DC

## 2014-07-30 NOTE — ED Notes (Signed)
Called to Treatment area at 1600 and [phone]. No response to calling her name

## 2014-07-30 NOTE — ED Notes (Signed)
Pt states she has always had a blackhead on her upper right back but noticed some redness and swelling around the head today. Hurts to lift arm because of location of sore. Wasn't able to go to work today.

## 2014-07-30 NOTE — ED Provider Notes (Signed)
CSN: [id number]     Arrival date & time 07/30/14  1744 History  This chart was scribed for [employer], PA-C, working with Gwyneth Sprout, MD by Chestine Spore, ED Scribe. The patient was seen in room TR07C/TR07C at 6:50 PM.    Chief Complaint  Patient presents with  . Abscess      Patient is a 39 y.o. female presenting with abscess. The history is provided by the patient. No language interpreter was used.  Abscess Location:  Torso Torso abscess location:  Upper back Abscess quality: redness   Abscess quality: not draining, not painful and no warmth   Red streaking: no   Duration:  3 days Progression:  Unchanged Chronicity:  Chronic Context: not diabetes, not immunosuppression, not injected drug use and not skin injury   Relieved by:  Nothing Exacerbated by: movement. Ineffective treatments:  Warm compresses and cold compresses (Vicodin and Percocet) Associated symptoms: no fever, no nausea and no vomiting   Risk factors: prior abscess     HPI Comments: Vanessa Walsh is a 39 y.o. female with no significant medical hx who presents to the Emergency Department complaining of abscess onset 3 days. Pt reports that the abscess is on her right upper back that resembles the appearance of a black head. She notes that her husband pops the abscess every couple months and when he does there is drainage of white and brown fluid, but this has not occurred today. Pt went to [facility]. Pt notes that her pain today is located at the R upper back, 10/10, constant, aching, throbbing, non-radiating. Pt reports that movement makes the pain worse. She states that she is having associated symptoms of redness, induration, and warmth. She states that she has tried Ibuprofen, vicodin, heat, and ice with no relief for her symptoms. She denies fevers, neck stiffness, neck pain, numbness, tingling, red streaking, rhinorrhea, CP, SOB, abdominal pain, n/v, and any other symptoms. Pt reports that she is  otherwise healthy and that she does not have DM. Pt does not have a PCP at this time. Pt was referred to [facility] and wellness but she does not like them.     History reviewed. No pertinent past medical history. Past Surgical History  Procedure Laterality Date  . Ovarian cyst surgery     No family history on file. History  Substance Use Topics  . Smoking status: Current Every Day Smoker -- 1.00 packs/day    Types: Cigarettes  . Smokeless tobacco: Not on file  . Alcohol Use: No   OB History    No data available     Review of Systems  Constitutional: Negative for fever and chills.  HENT: Negative for sore throat.   Respiratory: Negative for cough and shortness of breath.   Cardiovascular: Negative for chest pain.  Gastrointestinal: Negative for nausea, vomiting, abdominal pain and diarrhea.  Musculoskeletal: Negative for neck pain and neck stiffness.  Skin: Positive for color change.       Abscess to right upper back  Neurological: Negative for weakness and numbness.   A complete 10 system review of systems was obtained and all systems are negative except as noted in the HPI and PMH.     Allergies  Tramadol  Home Medications   Prior to Admission medications   Medication Sig Start Date End Date Taking? Authorizing Provider  acetaminophen (TYLENOL) 325 MG tablet Take 650 mg by mouth every 6 (six) hours as needed for mild pain.   Yes  Historical Provider, MD  ibuprofen (ADVIL,MOTRIN) 200 MG tablet Take 200 mg by mouth every 6 (six) hours as needed.   Yes Historical Provider, MD  cephALEXin (KEFLEX) 500 MG capsule Take 1 capsule (500 mg total) by mouth 4 (four) times daily. Patient not taking: Reported on 06/06/2014 06/03/14   Marlon Peliffany Greene, PA-C  HYDROcodone-acetaminophen (NORCO/VICODIN) 5-325 MG per tablet Take 1 tablet by mouth every 4 (four) hours as needed. Patient taking differently: Take 1 tablet by mouth every 4 (four) hours as needed for moderate pain.  06/03/14    Tiffany Neva SeatGreene, PA-C   BP 111/61 mmHg  Pulse 83  Temp(Src) 99 F (37.2 C) (Oral)  Resp 12  Ht 5\' 5"  (1.651 m)  Wt 190 lb (86.183 kg)  BMI 31.62 kg/m2  SpO2 100%  LMP 07/25/2014 (Approximate)   Physical Exam  Constitutional: She is oriented to person, place, and time. Vital signs are normal. She appears well-developed and well-nourished.  Non-toxic appearance. No distress.  Afebrile, nontoxic, NAD  HENT:  Head: Normocephalic and atraumatic.  Mouth/Throat: Mucous membranes are normal.  Eyes: Conjunctivae and EOM are normal. Right eye exhibits no discharge. Left eye exhibits no discharge.  Neck: Normal range of motion. Neck supple. No spinous process tenderness and no muscular tenderness present. No rigidity. Normal range of motion present.  FROM intact without spinous process or paraspinous muscle TTP, no bony stepoffs or deformities, no muscle spasms. No rigidity or meningeal signs. No bruising or swelling.  Cardiovascular: Normal rate.   Pulmonary/Chest: Effort normal. No respiratory distress.  Abdominal: Normal appearance. She exhibits no distension.  Musculoskeletal: Normal range of motion.  MAE x4 Strength and sensation grossly intact Distal pulses intact Right shoulder with FROM intact.   Lymphadenopathy:    She has cervical adenopathy.       Right cervical: Posterior cervical adenopathy present.  Shotty LAD to the right posterior cervical chain, with mild TTP.  Neurological: She is alert and oriented to person, place, and time. She has normal strength. No sensory deficit.  Skin: Skin is warm, dry and intact. No rash noted. There is erythema.  Indurated area approximately 2 cm in diameter with erythema and mild warmth located to the right trapezius area. No fluctuance, mildly TTP. No drainage  Psychiatric: She has a normal mood and affect. Her behavior is normal.  Nursing note and vitals reviewed.   ED Course  Procedures (including critical care time) DIAGNOSTIC  STUDIES: Oxygen Saturation is 96% on RA, nl by my interpretation.    COORDINATION OF CARE: 6:54 PM-Discussed treatment plan which includes abx, warm compresses, f/u in two days for a recheck with pt at bedside and pt agreed to plan.   Labs Review Labs Reviewed - No data to display  Imaging Review No results found.   EKG Interpretation None      MDM   Final diagnoses:  Abscess and cellulitis   39 y.o. female here with early abscess/cellulitis, no fluctuance or drainage, small indurated area with erythema and mild warmth. Does not appear to have fluid pocket to I&D, will give pain control and abx and instructed pt to f/up with [facility] in 2 days for recheck to see if it's fluctuant and ready for I&D at that time. Discussed warm compresses. I explained the diagnosis and have given explicit precautions to return to the ER including for any other new or worsening symptoms. The patient understands and accepts the medical plan as it's been dictated and I have answered their questions. Discharge  instructions concerning home care and prescriptions have been given. The patient is STABLE and is discharged to home in good condition.  BP 111/61 mmHg  Pulse 83  Temp(Src) 99 F (37.2 C) (Oral)  Resp 12  Ht  (1.651 m)  Wt 190 lb (86.183 kg)  BMI 31.62 kg/m2  SpO2 100%  LMP 07/25/2014 (Approximate)  Meds ordered this encounter  Medications  . oxyCODONE-acetaminophen (PERCOCET/ROXICET) 5-325 MG per tablet 1 tablet    Sig:   . oxyCODONE-acetaminophen (PERCOCET) 5-325 MG per tablet    Sig: Take 1 tablet by mouth every 6 (six) hours as needed for severe pain.    Dispense:  6 tablet    Refill:  0    Order Specific Question:  Supervising Provider    Answer:  MILLER, BRIAN [[id number]]  . naproxen (NAPROSYN) 500 MG tablet    Sig: Take 1 tablet (500 mg total) by mouth 2 (two) times daily as needed for mild pain, moderate pain or headache (TAKE WITH MEALS.).    Dispense:  20 tablet    Refill:  0     Order Specific Question:  Supervising Provider    Answer:  MILLER, BRIAN [[id number]]  . cephALEXin (KEFLEX) 500 MG capsule    Sig: 2 caps po bid x 7 days    Dispense:  28 capsule    Refill:  0    Order Specific Question:  Supervising Provider    Answer:  MILLER, BRIAN [[id number]]  . sulfamethoxazole-trimethoprim (BACTRIM DS,SEPTRA DS) 800-160 MG per tablet    Sig: Take 1 tablet by mouth 2 (two) times daily.    Dispense:  14 tablet    Refill:  0    Order Specific Question:  Supervising Provider    Answer:  Eber Hong [[id number]]    I personally performed the services described in this documentation, which was scribed in my presence. The recorded information has been reviewed and is accurate.   [address] [place], PA-C 07/30/14 1931  Gwyneth Sprout, MD 07/31/14 0009

## 2014-07-30 NOTE — Discharge Instructions (Signed)
Keep wound clean and dry. Apply warm compresses to affected area throughout the day. Take antibiotics until it is finished. Take naprosyn and percocet as directed, as needed for pain but do not drive or operate machinery with pain medication use. [facility] for wound recheck. Monitor area for signs of infection to include, but not limited to: increasing pain, redness, drainage/pus, or swelling. Return to emergency department for emergent changing or worsening symptoms.    Abscess An abscess (boil or furuncle) is an infected area on or under the skin. This area is filled with yellowish-white fluid (pus) and other material (debris). HOME CARE   Only take medicines as told by your doctor.  If you were given antibiotic medicine, take it as directed. Finish the medicine even if you start to feel better.  If gauze is used, follow your doctor's directions for changing the gauze.  To avoid spreading the infection:  Keep your abscess covered with a bandage.  Wash your hands well.  Do not share personal care items, towels, or whirlpools with others.  Avoid skin contact with others.  Keep your skin and clothes clean around the abscess.  Keep all doctor visits as told. GET HELP RIGHT AWAY IF:   You have more pain, puffiness (swelling), or redness in the wound site.  You have more fluid or blood coming from the wound site.  You have muscle aches, chills, or you feel sick.  You have a fever. MAKE SURE YOU:   Understand these instructions.  Will watch your condition.  Will get help right away if you are not doing well or get worse. Document Released: 09/11/2007 Document Revised: 09/24/2011 Document Reviewed: 06/07/2011 [facility] Patient Information 2015 [place], [place]. This information is not intended to replace advice given to you by your health care provider. Make sure you discuss any questions you have with your health care  provider.  Cellulitis Cellulitis is an infection of the skin and the tissue under the skin. The infected area is usually red and tender. This happens most often in the arms and lower legs. HOME CARE   Take your antibiotic medicine as told. Finish the medicine even if you start to feel better.  Keep the infected arm or leg raised (elevated).  Put a warm cloth on the area up to 4 times per day.  Only take medicines as told by your doctor.  Keep all doctor visits as told. GET HELP IF:  You see red streaks on the skin coming from the infected area.  Your red area gets bigger or turns a dark color.  Your bone or joint under the infected area is painful after the skin heals.  Your infection comes back in the same area or different area.  You have a puffy (swollen) bump in the infected area.  You have new symptoms.  You have a fever. GET HELP RIGHT AWAY IF:   You feel very sleepy.  You throw up (vomit) or have watery poop (diarrhea).  You feel sick and have muscle aches and pains. MAKE SURE YOU:   Understand these instructions.  Will watch your condition.  Will get help right away if you are not doing well or get worse. Document Released: 09/11/2007 Document Revised: 08/09/2013 Document Reviewed: 06/10/2011 [facility] Patient Information 2015 [place]Care, [place]. This information is not intended to replace advice given to you by your health care provider. Make sure you discuss any questions you have with your health  care provider.  

## 2015-08-14 ENCOUNTER — Encounter (HOSPITAL_COMMUNITY): Payer: Self-pay | Admitting: Emergency Medicine

## 2015-08-14 ENCOUNTER — Emergency Department (HOSPITAL_COMMUNITY)
Admission: EM | Admit: 2015-08-14 | Discharge: 2015-08-15 | Disposition: A | Discharge status: Home / Self Care | Payer: Medicaid Other | Attending: Emergency Medicine | Admitting: Emergency Medicine

## 2015-08-14 DIAGNOSIS — N61 Mastitis without abscess: Secondary | ICD-10-CM | POA: Insufficient documentation

## 2015-08-14 DIAGNOSIS — Z79899 Other long term (current) drug therapy: Secondary | ICD-10-CM | POA: Insufficient documentation

## 2015-08-14 DIAGNOSIS — Z79891 Long term (current) use of opiate analgesic: Secondary | ICD-10-CM | POA: Diagnosis not present

## 2015-08-14 DIAGNOSIS — F17210 Nicotine dependence, cigarettes, uncomplicated: Secondary | ICD-10-CM | POA: Diagnosis not present

## 2015-08-14 DIAGNOSIS — Z792 Long term (current) use of antibiotics: Secondary | ICD-10-CM | POA: Diagnosis not present

## 2015-08-14 DIAGNOSIS — Z791 Long term (current) use of non-steroidal anti-inflammatories (NSAID): Secondary | ICD-10-CM | POA: Insufficient documentation

## 2015-08-14 DIAGNOSIS — N644 Mastodynia: Secondary | ICD-10-CM | POA: Diagnosis present

## 2015-08-14 NOTE — ED Notes (Signed)
Pt has had an area around her R areola that is red and painful x 2 days. States it happened 1 month ago but it went away. Alert and oriented.

## 2015-08-15 MED ORDER — CLINDAMYCIN HCL 150 MG PO CAPS: 300.0000 mg | ORAL_CAPSULE | Freq: Three times a day (TID) | ORAL | Status: DC

## 2015-08-15 MED ORDER — CLINDAMYCIN HCL 300 MG PO CAPS
300.0000 mg | ORAL_CAPSULE | Freq: Once | ORAL | Status: AC
  Administered 2015-08-15: 300 mg via ORAL
  Filled 2015-08-15: qty 1

## 2015-08-15 MED ORDER — OXYCODONE-ACETAMINOPHEN 5-325 MG PO TABS
2.0000 | ORAL_TABLET | Freq: Once | ORAL | Status: AC
  Administered 2015-08-15: 2 via ORAL
  Filled 2015-08-15: qty 2

## 2015-08-15 MED ORDER — OXYCODONE-ACETAMINOPHEN 5-325 MG PO TABS: 1.0000 | ORAL_TABLET | ORAL | Status: DC | PRN

## 2015-08-15 NOTE — ED Provider Notes (Signed)
CSN: [id number]     Arrival date & time 08/14/15  1937 History   First MD Initiated Contact with Patient 08/15/15 [phone]     Chief Complaint  Patient presents with  . Breast Problem     (Consider location/radiation/quality/duration/timing/severity/associated sxs/prior Treatment) HPI Comments: 40 year old female presents to the emergency department for evaluation of pain to her right breast and areola. Patient states that the area has been painful for the past 2 days. Symptoms have been constant and worsening since onset. She denies pain relief with Norco. Patient has noted some redness to the area. She denies discharge from her nipple. She is not currently breast-feeding. No associated fevers. She does state that she had similar symptoms 1 month ago which resolved spontaneously.  The history is provided by the patient. No language interpreter was used.    History reviewed. No pertinent past medical history. Past Surgical History  Procedure Laterality Date  . Ovarian cyst surgery     History reviewed. No pertinent family history. Social History  Substance Use Topics  . Smoking status: Current Every Day Smoker -- 1.00 packs/day    Types: Cigarettes  . Smokeless tobacco: None  . Alcohol Use: No   OB History    No data available      Review of Systems  Constitutional: Negative for fever.  Cardiovascular: Positive for chest pain (right breast).  Skin: Positive for color change.  All other systems reviewed and are negative.   Allergies  Ibuprofen and Tramadol  Home Medications   Prior to Admission medications   Medication Sig Start Date End Date Taking? Authorizing Provider  cephALEXin (KEFLEX) 500 MG capsule Take 1 capsule (500 mg total) by mouth 4 (four) times daily. Patient not taking: Reported on 06/06/2014 06/03/14   Marlon Pel, PA-C  cephALEXin [facility]) 500 MG capsule 2 caps po bid x 7 days 07/30/14   Mercedes Camprubi-Soms, PA-C  clindamycin (CLEOCIN) 150 MG capsule  Take 2 capsules (300 mg total) by mouth 3 (three) times daily. May dispense as  capsules 08/15/15   Antony Madura, PA-C  HYDROcodone-acetaminophen (NORCO/VICODIN) 5-325 MG per tablet Take 1 tablet by mouth every 4 (four) hours as needed. Patient taking differently: Take 1 tablet by mouth every 4 (four) hours as needed for moderate pain.  06/03/14   Tiffany Neva Seat, PA-C  naproxen (NAPROSYN) 500 MG tablet Take 1 tablet (500 mg total) by mouth 2 (two) times daily as needed for mild pain, moderate pain or headache (TAKE WITH MEALS.). 07/30/14   Mercedes Camprubi-Soms, PA-C  oxyCODONE-acetaminophen (PERCOCET) 5-325 MG tablet Take 1 tablet by mouth every 4 (four) hours as needed for severe pain. 08/15/15   Antony Madura, PA-C  sulfamethoxazole-trimethoprim (BACTRIM DS,SEPTRA DS) 800-160 MG per tablet Take 1 tablet by mouth 2 (two) times daily. 07/30/14   Mercedes Camprubi-Soms, PA-C   BP 115/73 mmHg  Pulse 76  Temp(Src) 98.3 F (36.8 C) (Oral)  Resp 18  SpO2 100%  LMP 07/22/2015 (Exact Date)   Physical Exam  Constitutional: She is oriented to person, place, and time. She appears well-developed and well-nourished. No distress.  Nontoxic-appearing  HENT:  Head: Normocephalic and atraumatic.  Eyes: Conjunctivae and EOM are normal. No scleral icterus.  Neck: Normal range of motion.  Cardiovascular: Normal rate, regular rhythm and intact distal pulses.   Pulmonary/Chest: Effort normal. No respiratory distress. She has no wheezes. Right breast exhibits skin change and tenderness. Right breast exhibits no nipple discharge. Left breast exhibits no nipple discharge, no skin change  and no tenderness.    Respirations even and unlabored  Musculoskeletal: Normal range of motion.  Neurological: She is alert and oriented to person, place, and time. She exhibits normal muscle tone. Coordination normal.  Skin: Skin is warm and dry. No rash noted. She is not diaphoretic. No erythema. No pallor.  Psychiatric: She has  a normal mood and affect. Her behavior is normal.  Nursing note and vitals reviewed.   ED Course  Procedures (including critical care time) Labs Review Labs Reviewed - No data to display  Imaging Review No results found.   I have personally reviewed and evaluated these images and lab results as part of my medical decision-making.   EKG Interpretation None      MDM   Final diagnoses:  Acute mastitis of right breast    40 year old female presents to the emergency department for evaluation of right breast pain with redness and induration. Symptoms consistent with mastitis and concerning for underlying abscess. Patient is afebrile and hemodynamically stable. Plan to manage with warm compresses and clindamycin. Patient given referral to general surgery for follow-up. Percocet prescribed for pain control. Return precautions discussed and provided. Patient discharged in satisfactory condition with no unaddressed concerns.   Filed Vitals:   08/14/15 2029 08/14/15 2325 08/15/15 0254  BP: 126/84 116/67 115/73  Pulse: 86 78 76  Temp: 98.5 F (36.9 C)  98.3 F (36.8 C)  TempSrc: Oral  Oral  Resp: 16 16 18   SpO2: 100% 100% 100%      Antony MaduraKelly Niels Cranshaw, PA-C 08/15/15 0501  Laurence Spatesachel Morgan Little, MD 08/21/15 [phone]

## 2015-08-15 NOTE — Discharge Instructions (Signed)
Take clindamycin as prescribed for symptoms. We also recommend that you apply warm compresses to the breast 3-4 times per day. You may take Percocet as needed for severe pain. [facility] as it is possible that you may have an abscess (pus pocket) in your breast requiring surgical drainage. Return to the ED if symptoms worsen, such as if you develop a fever over 100.70F or experience worsening redness despite antibiotics.  Mastitis Mastitis is inflammation of the breast tissue. It occurs most often in women who are breastfeeding, but it can also affect other women, and even sometimes men. CAUSES  Mastitis is usually caused by a bacterial infection. Bacteria enter the breast tissue through cuts or openings in the skin. Typically, this occurs with breastfeeding because of cracked or irritated skin. Sometimes, it can occur even when there is no opening in the skin. It can be associated with plugged milk (lactiferous) ducts. Nipple piercing can also lead to mastitis. Also, some forms of breast cancer can cause mastitis. SIGNS AND SYMPTOMS   Swelling, redness, tenderness, and pain in an area of the breast.  Swelling of the glands under the arm on the same side.  Fever. If an infection is allowed to progress, a collection of pus (abscess) may develop. DIAGNOSIS  Your health care provider can usually diagnose mastitis based on your symptoms and a physical exam. Tests may be done to help confirm the diagnosis. These may include:   Removal of pus from the breast by applying pressure to the area. This pus can be examined in the lab to determine which bacteria are present. If an abscess has developed, the fluid in the abscess can be removed with a needle. This can also be used to confirm the diagnosis and determine the bacteria present. In most cases, pus will not be present.  Blood tests to determine if your body is fighting a bacterial infection.  Mammogram  or ultrasound tests to rule out other problems or diseases. TREATMENT  Antibiotic medicine is used to treat a bacterial infection. Your health care provider will determine which bacteria are most likely causing the infection and will select an appropriate antibiotic. This is sometimes changed based on the results of tests performed to identify the bacteria, or if there is no response to the antibiotic selected. Antibiotics are usually given by mouth. You may also be given medicine for pain. Mastitis that occurs with breastfeeding will sometimes go away on its own, so your health care provider may choose to wait 24 hours after first seeing you to decide whether a prescription medicine is needed. HOME CARE INSTRUCTIONS   Only take over-the-counter or prescription medicines for pain, fever, or discomfort as directed by your health care provider.  If your health care provider prescribed an antibiotic, take the medicine as directed. Make sure you finish it even if you start to feel better.  Do not wear a tight or underwire bra. Wear a soft, supportive bra.  Increase your fluid intake, especially if you have a fever.  Women who are breastfeeding should follow these instructions:  Continue to empty the breast. Your health care provider can tell you whether this milk is safe for your infant or needs to be thrown out. You may be told to stop nursing until your health care provider thinks it is safe for your baby. Use a breast pump if you are advised to stop nursing.  Keep your nipples clean and dry.  Empty the first  breast completely before going to the other breast. If your baby is not emptying your breasts completely for some reason, use a breast pump to empty your breasts.  If you go back to work, pump your breasts while at work to stay in time with your nursing schedule.  Avoid allowing your breasts to become overly filled with milk (engorged). SEEK MEDICAL CARE IF:   You have pus-like  discharge from the breast.  Your symptoms do not improve with the treatment prescribed by your health care provider within 2 days. SEEK IMMEDIATE MEDICAL CARE IF:   Your pain and swelling are getting worse.  You have pain that is not controlled with medicine.  You have a red line extending from the breast toward your armpit.  You have a fever or persistent symptoms for more than 2-3 days.  You have a fever and your symptoms suddenly get worse.   This information is not intended to replace advice given to you by your health care provider. Make sure you discuss any questions you have with your health care provider.   Document Released: 03/25/2005 Document Revised: 03/30/2013 Document Reviewed: 10/23/2012 Elsevier Interactive Patient Education [employer]

## 2015-09-01 ENCOUNTER — Telehealth: Payer: Self-pay | Admitting: *Deleted

## 2015-09-01 NOTE — ED Notes (Signed)
Contacted by [facility] for approval of Percocet prescription issued on 08/15/2015.  Verified prescription is valid and raised question as to prescription for Clindamycin.  States she had this filled at time of visit at another pharmacy.  Authorized Percocet prescription with added note to patient chart.

## 2015-11-30 ENCOUNTER — Encounter (HOSPITAL_COMMUNITY): Payer: Self-pay | Admitting: Emergency Medicine

## 2015-11-30 ENCOUNTER — Emergency Department (HOSPITAL_COMMUNITY)
Admission: EM | Admit: 2015-11-30 | Discharge: 2015-11-30 | Disposition: A | Discharge status: Home / Self Care | Payer: Medicaid Other | Attending: Emergency Medicine | Admitting: Emergency Medicine

## 2015-11-30 DIAGNOSIS — M62830 Muscle spasm of back: Secondary | ICD-10-CM

## 2015-11-30 DIAGNOSIS — S39012A Strain of muscle, fascia and tendon of lower back, initial encounter: Secondary | ICD-10-CM | POA: Insufficient documentation

## 2015-11-30 DIAGNOSIS — F17210 Nicotine dependence, cigarettes, uncomplicated: Secondary | ICD-10-CM | POA: Insufficient documentation

## 2015-11-30 DIAGNOSIS — Y939 Activity, unspecified: Secondary | ICD-10-CM | POA: Diagnosis not present

## 2015-11-30 DIAGNOSIS — R202 Paresthesia of skin: Secondary | ICD-10-CM | POA: Insufficient documentation

## 2015-11-30 DIAGNOSIS — M545 Low back pain: Secondary | ICD-10-CM | POA: Diagnosis present

## 2015-11-30 DIAGNOSIS — Y999 Unspecified external cause status: Secondary | ICD-10-CM | POA: Diagnosis not present

## 2015-11-30 DIAGNOSIS — Y92410 Unspecified street and highway as the place of occurrence of the external cause: Secondary | ICD-10-CM | POA: Diagnosis not present

## 2015-11-30 DIAGNOSIS — M5136 Other intervertebral disc degeneration, lumbar region: Secondary | ICD-10-CM

## 2015-11-30 MED ORDER — CYCLOBENZAPRINE HCL 10 MG PO TABS: 10.0000 mg | ORAL_TABLET | Freq: Three times a day (TID) | ORAL | 0 refills | Status: DC | PRN

## 2015-11-30 MED ORDER — NAPROXEN 500 MG PO TABS: 500.0000 mg | ORAL_TABLET | Freq: Two times a day (BID) | ORAL | 0 refills | Status: DC | PRN
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
## 2015-11-30 NOTE — Discharge Instructions (Signed)
Take naprosyn as directed for inflammation and pain with tylenol for breakthrough pain and flexeril for muscle relaxation. Do not drive or operate machinery with muscle relaxant use. Ice to areas of soreness for the next 24 hours and then may move to heat, no more than 20 minutes at a time every hour for each. Expect to be sore for the next few days and follow up with primary care physician for recheck of ongoing symptoms in the next 1-2 weeks. Return to ER for emergent changing or worsening of symptoms.  Your lumbar spine xrays from 07/28/13 showed degenerative disc disease in some of the vertebra spaces, this is likely the cause of your chronic back pain. See your regular doctor to discuss this finding, and for ongoing management or referrals that may be indicated for management of this issue.

## 2015-11-30 NOTE — ED Notes (Signed)
Pt ambulatory and independent at discharge.  Verbalized understanding of discharge instructions 
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
## 2016-01-30 ENCOUNTER — Emergency Department (HOSPITAL_COMMUNITY)
Admission: EM | Admit: 2016-01-30 | Discharge: 2016-01-30 | Disposition: A | Discharge status: Home / Self Care | Payer: Medicaid Other | Attending: Emergency Medicine | Admitting: Emergency Medicine

## 2016-01-30 ENCOUNTER — Encounter (HOSPITAL_COMMUNITY): Payer: Self-pay | Admitting: Emergency Medicine

## 2016-01-30 DIAGNOSIS — Z79899 Other long term (current) drug therapy: Secondary | ICD-10-CM | POA: Insufficient documentation

## 2016-01-30 DIAGNOSIS — Z5321 Procedure and treatment not carried out due to patient leaving prior to being seen by health care provider: Secondary | ICD-10-CM | POA: Diagnosis not present

## 2016-01-30 DIAGNOSIS — F17210 Nicotine dependence, cigarettes, uncomplicated: Secondary | ICD-10-CM | POA: Insufficient documentation

## 2016-01-30 DIAGNOSIS — Z792 Long term (current) use of antibiotics: Secondary | ICD-10-CM | POA: Insufficient documentation

## 2016-01-30 DIAGNOSIS — F410 Panic disorder [episodic paroxysmal anxiety] without agoraphobia: Secondary | ICD-10-CM | POA: Diagnosis present
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
## 2016-01-30 NOTE — ED Notes (Signed)
Bed: WA06 Expected date:  Expected time:  Means of arrival:  Comments: EMS 
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
## 2016-08-06 ENCOUNTER — Encounter (HOSPITAL_COMMUNITY): Payer: Self-pay | Admitting: *Deleted

## 2016-08-06 ENCOUNTER — Inpatient Hospital Stay (HOSPITAL_COMMUNITY)
Admission: AD | Admit: 2016-08-06 | Discharge: 2016-08-06 | Disposition: A | Discharge status: Home / Self Care | Payer: Medicaid Other | Source: Ambulatory Visit | Attending: Obstetrics and Gynecology | Admitting: Obstetrics and Gynecology

## 2016-08-06 DIAGNOSIS — R42 Dizziness and giddiness: Secondary | ICD-10-CM

## 2016-08-06 DIAGNOSIS — O26891 Other specified pregnancy related conditions, first trimester: Secondary | ICD-10-CM | POA: Insufficient documentation

## 2016-08-06 DIAGNOSIS — Z3A08 8 weeks gestation of pregnancy: Secondary | ICD-10-CM | POA: Diagnosis not present

## 2016-08-06 DIAGNOSIS — R110 Nausea: Secondary | ICD-10-CM | POA: Insufficient documentation

## 2016-08-06 DIAGNOSIS — O219 Vomiting of pregnancy, unspecified: Secondary | ICD-10-CM | POA: Insufficient documentation

## 2016-08-06 HISTORY — DX: Other specified health status: Z78.9

## 2016-08-06 LAB — URINALYSIS, ROUTINE W REFLEX MICROSCOPIC
BILIRUBIN URINE: NEGATIVE
GLUCOSE, UA: NEGATIVE mg/dL
HGB URINE DIPSTICK: NEGATIVE
KETONES UR: NEGATIVE mg/dL
NITRITE: POSITIVE — AB
PROTEIN: 30 mg/dL — AB
Specific Gravity, Urine: 1.029 (ref 1.005–1.030)
pH: 6 (ref 5.0–8.0)

## 2016-08-06 LAB — POCT PREGNANCY, URINE: Preg Test, Ur: POSITIVE — AB

## 2016-08-06 NOTE — MAU Note (Signed)
Had gotten really light headed and keep throwing up.  LMP was 3/1, sometimes she skips.  +HPT  2 wks ago.

## 2016-08-06 NOTE — MAU Provider Note (Signed)
History     CSN: [id number]  Arrival date and time: 08/06/16 1804   First Provider Initiated Contact with Patient 08/06/16 1843      Chief Complaint  Patient presents with  . Possible Pregnancy  . Dizziness  . Nausea   [id number]  by LMP here for dizziness and N/V. She had one brief episode of dizziness today and thinks this was because she was working to much, about 17 hrs per day. She also reports intermittent N/V over the last week. She can tolerate most things po. She denies fever. No congestion or respiratory sx. No VB or abdominal pain. She states "I am fine, I just needed my proof of pregnancy".     OB History    Gravida Para Term Preterm AB Living   SAB TAB Ectopic Multiple Live Births           9      Past Medical History:  Diagnosis Date  . Medical history non-contributory     Past Surgical History:  Procedure Laterality Date  . OVARIAN CYST SURGERY      No family history on file.  Social History  Substance Use Topics  . Smoking status: Former Smoker    Packs/day: 1.00    Types: Cigarettes  . Smokeless tobacco: Former Neurosurgeon  . Alcohol use No    Allergies:  Allergies  Allergen Reactions  . Ibuprofen Other (See Comments)    Pt does not recall specific reaction  . Tramadol Hives    hives    No prescriptions prior to admission.    Review of Systems  Constitutional: Negative for chills and fever.  HENT: Negative for congestion and ear pain.   Respiratory: Negative for cough.   Gastrointestinal: Positive for nausea and vomiting. Negative for abdominal pain, constipation and diarrhea.  Genitourinary: Negative for vaginal bleeding.  Neurological: Positive for dizziness.   Physical Exam   Blood pressure 110/73, pulse 85, temperature 98.8 F (37.1 C), temperature source Oral, resp. rate 16, weight 77.7 kg (171 lb 4 oz), last menstrual period 06/06/2016, SpO2 100 %.  Physical Exam  Constitutional: She is oriented to person,  place, and time. She appears well-developed and well-nourished. No distress.  HENT:  Head: Normocephalic and atraumatic.  Neck: Normal range of motion.  Cardiovascular: Normal rate.   Respiratory: Effort normal.  Musculoskeletal: Normal range of motion.  Neurological: She is alert and oriented to person, place, and time.  Skin: Skin is warm and dry.  Psychiatric: She has a normal mood and affect.   Results for orders placed or performed during the hospital encounter of 08/06/16 (from the past 24 hour(s))  Pregnancy, urine POC     Status: Abnormal   Collection Time: 08/06/16  6:19 PM  Result Value Ref Range   Preg Test, Ur POSITIVE (A) NEGATIVE  Urinalysis, Routine w reflex microscopic     Status: Abnormal   Collection Time: 08/06/16  6:21 PM  Result Value Ref Range   Color, Urine AMBER (A) YELLOW   APPearance CLOUDY (A) CLEAR   Specific Gravity, Urine 1.029 1.005 - 1.030   pH 6.0 5.0 - 8.0   Glucose, UA NEGATIVE NEGATIVE mg/dL   Hgb urine dipstick NEGATIVE NEGATIVE   Bilirubin Urine NEGATIVE NEGATIVE   Ketones, ur NEGATIVE NEGATIVE mg/dL   Protein, ur 30 (A) NEGATIVE mg/dL   Nitrite POSITIVE (A) NEGATIVE   Leukocytes, UA TRACE (A) NEGATIVE   RBC /  HPF 0-5 0 - 5 RBC/hpf   WBC, UA 6-30 0 - 5 WBC/hpf   Bacteria, UA FEW (A) NONE SEEN   Squamous Epithelial / LPF 6-30 (A) NONE SEEN   Mucous PRESENT     MAU Course  Procedures  MDM Labs ordered and reviewed. Pt declines further evaluation. Discussed options for OTC nausea meds. Pregnancy verification letter provided. Will add UC since UA is abnormal. Stable for discharge home.  Assessment and Plan   1. [redacted] weeks gestation of pregnancy   2. Dizziness   3. Nausea/vomiting in pregnancy    Discharge home Follow up with PCP Dr. Ronne Binning tomorrow as scheduled-she states he will provide [facility] Return for worsening sx  Allergies as of 08/06/2016      Reactions   Ibuprofen Other (See Comments)   Pt does not recall specific reaction    Tramadol Hives   hives      Medication List    STOP taking these medications   cephALEXin 500 MG capsule Commonly known as:  KEFLEX   clindamycin 150 MG capsule Commonly known as:  CLEOCIN   cyclobenzaprine 10 MG tablet Commonly known as:  FLEXERIL   HYDROcodone-acetaminophen 5-325 MG tablet Commonly known as:  NORCO/VICODIN   naproxen 500 MG tablet Commonly known as:  NAPROSYN   oxyCODONE-acetaminophen 5-325 MG tablet Commonly known as:  PERCOCET   sulfamethoxazole-trimethoprim 800-160 MG tablet Commonly known as:  BACTRIM DS,SEPTRA DS      Donette Larry, CNM 08/06/2016, 9:00 PM

## 2016-08-08 LAB — CULTURE, OB URINE

## 2016-08-26 ENCOUNTER — Telehealth: Payer: Self-pay | Admitting: *Deleted

## 2016-08-26 NOTE — Telephone Encounter (Signed)
Received message left on nurse voicemail on 08/26/16 at 1045 and 1103.  Patient states she was seen about 1 month ago for a pregnancy test.  States her test was positive but her husband doesn't believe her.  States she showed him her paperwork from her visit but he thinks the papers are fake from the internet.  Patient requests we call her husband Bernette RedbirdKenny at [phone] and verify her positive pregnancy test results with him.

## 2016-08-29 ENCOUNTER — Emergency Department (HOSPITAL_COMMUNITY)
Admission: EM | Admit: 2016-08-29 | Discharge: 2016-08-29 | Disposition: A | Discharge status: Home / Self Care | Payer: Medicaid Other | Attending: Emergency Medicine | Admitting: Emergency Medicine

## 2016-08-29 ENCOUNTER — Encounter (HOSPITAL_COMMUNITY): Payer: Self-pay | Admitting: Emergency Medicine

## 2016-08-29 DIAGNOSIS — R55 Syncope and collapse: Secondary | ICD-10-CM | POA: Insufficient documentation

## 2016-08-29 DIAGNOSIS — F17210 Nicotine dependence, cigarettes, uncomplicated: Secondary | ICD-10-CM | POA: Diagnosis not present

## 2016-08-29 DIAGNOSIS — Z202 Contact with and (suspected) exposure to infections with a predominantly sexual mode of transmission: Secondary | ICD-10-CM | POA: Insufficient documentation

## 2016-08-29 DIAGNOSIS — N3000 Acute cystitis without hematuria: Secondary | ICD-10-CM | POA: Diagnosis not present

## 2016-08-29 DIAGNOSIS — R300 Dysuria: Secondary | ICD-10-CM | POA: Diagnosis present

## 2016-08-29 LAB — I-STAT CHEM 8, ED
BUN: 7 mg/dL (ref 6–20)
CALCIUM ION: 1.16 mmol/L (ref 1.15–1.40)
CREATININE: 0.9 mg/dL (ref 0.44–1.00)
Chloride: 103 mmol/L (ref 101–111)
GLUCOSE: 95 mg/dL (ref 65–99)
HCT: 38 % (ref 36.0–46.0)
Hemoglobin: 12.9 g/dL (ref 12.0–15.0)
Potassium: 3.5 mmol/L (ref 3.5–5.1)
Sodium: 138 mmol/L (ref 135–145)
TCO2: 28 mmol/L (ref 0–100)

## 2016-08-29 LAB — URINALYSIS, ROUTINE W REFLEX MICROSCOPIC
Bilirubin Urine: NEGATIVE
Glucose, UA: NEGATIVE mg/dL
KETONES UR: NEGATIVE mg/dL
Nitrite: POSITIVE — AB
PROTEIN: NEGATIVE mg/dL
Specific Gravity, Urine: 1.02 (ref 1.005–1.030)
pH: 6 (ref 5.0–8.0)

## 2016-08-29 LAB — WET PREP, GENITAL
Sperm: NONE SEEN
TRICH WET PREP: NONE SEEN
YEAST WET PREP: NONE SEEN

## 2016-08-29 LAB — POC URINE PREG, ED: PREG TEST UR: NEGATIVE

## 2016-08-29 MED ORDER — NAPROXEN 500 MG PO TABS
500.0000 mg | ORAL_TABLET | Freq: Once | ORAL | Status: DC
  Filled 2016-08-29: qty 1

## 2016-08-29 MED ORDER — CEPHALEXIN 500 MG PO CAPS: 500.0000 mg | ORAL_CAPSULE | Freq: Two times a day (BID) | ORAL | 0 refills | Status: DC

## 2016-08-29 MED ORDER — PHENAZOPYRIDINE HCL 200 MG PO TABS: 200.0000 mg | ORAL_TABLET | Freq: Three times a day (TID) | ORAL | 0 refills | Status: DC

## 2016-08-29 MED ORDER — STERILE WATER FOR INJECTION IJ SOLN
INTRAMUSCULAR | Status: AC
  Administered 2016-08-29: 1.2 mL
  Filled 2016-08-29: qty 10

## 2016-08-29 MED ORDER — CEFTRIAXONE SODIUM 250 MG IJ SOLR
250.0000 mg | Freq: Once | INTRAMUSCULAR | Status: AC
  Administered 2016-08-29: 250 mg via INTRAMUSCULAR
  Filled 2016-08-29: qty 250

## 2016-08-29 MED ORDER — AZITHROMYCIN 250 MG PO TABS
1000.0000 mg | ORAL_TABLET | Freq: Once | ORAL | Status: AC
  Administered 2016-08-29: 1000 mg via ORAL
  Filled 2016-08-29: qty 4

## 2016-08-29 MED ORDER — HYDROCODONE-ACETAMINOPHEN 5-325 MG PO TABS
1.0000 | ORAL_TABLET | Freq: Once | ORAL | Status: AC
  Administered 2016-08-29: 1 via ORAL
  Filled 2016-08-29: qty 1

## 2016-08-29 NOTE — ED Provider Notes (Signed)
[facility]-EMERGENCY DEPT Provider Note   CSN: [id number] Arrival date & time: 08/29/16  2011     History   Chief Complaint Chief Complaint  Patient presents with  . Exposure to STD    HPI Vanessa Walsh is a 41 y.o. female who presents with dysuria and syncopal episode. She states that several months ago she broke up with her husband. This past week she had intercourse with a new partner and her husband which was unprotected. Today she started to have dysuria with lower abdominal cramping. Along with severe dysuria she had a syncopal episode while standing at the store today. She does not recall any prodromal symptoms but cannot elaborate on the details of her passing out either. She denies fever or any recent illnesses, headache, chest pain, SOB, N/V/D, vaginal discharge or bleeding. Of note, she had a positive pregnancy test at the beginning of May and states she had a therapeutic abortion.  HPI  Past Medical History:  Diagnosis Date  . Medical history non-contributory     There are no active problems to display for this patient.   Past Surgical History:  Procedure Laterality Date  . OVARIAN CYST SURGERY      OB History    Gravida Para Term Preterm AB Living   10 9 9     9    SAB TAB Ectopic Multiple Live Births           9       Home Medications    Prior to Admission medications   Not on File    Family History No family history on file.  Social History Social History  Substance Use Topics  . Smoking status: Current Every Day Smoker    Packs/day: 0.50    Types: Cigarettes  . Smokeless tobacco: Former NeurosurgeonUser  . Alcohol use No     Allergies   Ibuprofen and Tramadol   Review of Systems Review of Systems  Constitutional: Negative for fever.  Respiratory: Negative for shortness of breath.   Cardiovascular: Negative for chest pain.  Gastrointestinal: Positive for abdominal pain. Negative for diarrhea, nausea and vomiting.  Genitourinary: Positive for  dysuria. Negative for flank pain, pelvic pain, vaginal bleeding and vaginal discharge.  Neurological: Positive for syncope. Negative for headaches.     Physical Exam Updated Vital Signs BP 110/74 (BP Location: Right Arm)   Pulse 70   Temp 98.2 F (36.8 C) (Oral)   Resp 18   LMP 06/06/2016   SpO2 99%   Breastfeeding? Unknown   Physical Exam  Constitutional: She is oriented to person, place, and time. She appears well-developed and well-nourished. No distress.  Well-appearing  HENT:  Head: Normocephalic and atraumatic.  Eyes: Conjunctivae are normal. Pupils are equal, round, and reactive to light. Right eye exhibits no discharge. Left eye exhibits no discharge. No scleral icterus.  Neck: Normal range of motion.  Cardiovascular: Normal rate and regular rhythm.  Exam reveals no gallop and no friction rub.   No murmur heard. Pulmonary/Chest: Effort normal and breath sounds normal. No respiratory distress. She has no wheezes. She has no rales. She exhibits no tenderness.  Abdominal: Soft. Bowel sounds are normal. She exhibits no distension and no mass. There is tenderness (suprapubic tenderness). There is no rebound and no guarding. No hernia.  Genitourinary:  Genitourinary Comments: No inguinal lymphadenopathy or inguinal hernia noted. Normal external genitalia. No pain with speculum insertion. Closed cervical os with normal appearance - no rash or lesions. No significant discharge  or bleeding noted from cervix or in vaginal vault. On bimanual examination no adnexal tenderness or cervical motion tenderness. Chaperone present during exam.    Neurological: She is alert and oriented to person, place, and time.  Skin: Skin is warm and dry.  Psychiatric: She has a normal mood and affect. Her behavior is normal.  Nursing note and vitals reviewed.    ED Treatments / Results  Labs (all labs ordered are listed, but only abnormal results are displayed) Labs Reviewed  WET PREP, GENITAL -  Abnormal; Notable for the following:       Result Value   Clue Cells Wet Prep HPF POC PRESENT (*)    WBC, Wet Prep HPF POC FEW (*)    All other components within normal limits  URINALYSIS, ROUTINE W REFLEX MICROSCOPIC - Abnormal; Notable for the following:    APPearance HAZY (*)    Hgb urine dipstick SMALL (*)    Nitrite POSITIVE (*)    Leukocytes, UA SMALL (*)    Bacteria, UA RARE (*)    Squamous Epithelial / LPF 6-30 (*)    All other components within normal limits  POC URINE PREG, ED  I-STAT CHEM 8, ED  GC/CHLAMYDIA PROBE AMP ([place]) NOT AT [facility]    EKG  EKG Interpretation  Date/Time:  Thursday Aug 29 2016 22:47:40 EDT Ventricular Rate:  68 PR Interval:    QRS Duration: 94 QT Interval:  428 QTC Calculation: 456 R Axis:   72 Text Interpretation:  Sinus rhythm ST elev, probable normal early repol pattern No significant change since last tracing Confirmed by Linwood Dibbles [phone]) on 08/30/2016 12:27:07 PM       Radiology No results found.  Procedures Procedures (including critical care time)  Medications Ordered in ED Medications  cefTRIAXone (ROCEPHIN) injection 250 mg (250 mg Intramuscular Given 08/29/16 2322)  azithromycin (ZITHROMAX) tablet 1,000 mg (1,000 mg Oral Given 08/29/16 2322)  sterile water (preservative free) injection (1.2 mLs  Given 08/29/16 2323)  HYDROcodone-acetaminophen (NORCO/VICODIN) 5-325 MG per tablet 1 tablet (1 tablet Oral Given 08/29/16 2340)     Initial Impression / Assessment and Plan / ED Course  I have reviewed the triage vital signs and the nursing notes.  Pertinent labs & imaging results that were available during my care of the patient were reviewed by me and considered in my medical decision making (see chart for details).  41 year old female with dysuria and syncope. Possibly vasovagal due to the severe pain she describes but she also denies prodromal symptoms. EKG is SR with no significant change. Vitals are normal. Labs are  normal. UA has rare bacteria, small hgb, +nitrites, and 6-30 WBC. Wet prep has clue cells and few WBC but she denies discharge. Will treat for G&C prophylactically since she has had intercourse with multiple partners and give rx for keflex for 5 days. She is refusing HIV/RPR testing stating she gets this done [facility] a [facility]. Return precautions given.   Final Clinical Impressions(s) / ED Diagnoses   Final diagnoses:  Possible exposure to STD  Acute cystitis without hematuria  Syncope, unspecified syncope type    New Prescriptions New Prescriptions   No medications on file     Beryle Quant 09/01/16 1351    Loren Racer, MD 09/09/16 2359

## 2016-08-29 NOTE — Telephone Encounter (Signed)
I called pt and left message on her personal voice mail. I stated that we cannot discuss her medical information with her husband as requested on her voice message. We will need a signed release of information in order to do so or she may schedule a nurse visit and bring her husband with her to the visit. In her presence and with verbal consent, we can discuss the test results with her husband. Also I noticed from the [facility] provider notes on 5/1 that she stated [facility]. I informed pt that Dr. Ronne BinningMcKenzie does not do prenatal care.  She will need to schedule care at a qualified Obstetrician office. She may call our office back if she has questions or needs assistance scheduling prenatal care.

## 2016-08-29 NOTE — ED Notes (Signed)
EKG given to EDP,Yelverton,MD., for review. 

## 2016-08-29 NOTE — ED Notes (Signed)
Pt. Refused lab draw, RN, Jeneen made aware. 

## 2016-08-29 NOTE — ED Notes (Signed)
Patient refused RPR and HIV test.

## 2016-08-29 NOTE — Discharge Instructions (Signed)
Take antibiotic for the next 5 days Take Pyridium for pain/spasms Return for worsening symptoms

## 2016-08-29 NOTE — ED Notes (Signed)
Patient refused the naproxen. She stated that " That medicine does not do shit. I will go home and get someone to give me something."

## 2016-08-29 NOTE — ED Notes (Signed)
PA notified patient wants pain medication. ?

## 2016-08-29 NOTE — ED Triage Notes (Signed)
Pt comes in with complaints of pain during urination. Pt states she recently had intercourse with someone that she was not as familiar with and may have been exposed to an STD. Tearful during triage.  States she does have some abdominal cramping as well.  Ambulatory and A&O x4.  Denies N&V.

## 2016-09-03 LAB — CERVICOVAGINAL ANCILLARY ONLY
CHLAMYDIA, DNA PROBE: NEGATIVE
Neisseria Gonorrhea: NEGATIVE

## 2017-01-03 ENCOUNTER — Encounter (HOSPITAL_COMMUNITY): Payer: Self-pay | Admitting: Neurology

## 2017-01-03 ENCOUNTER — Emergency Department (HOSPITAL_COMMUNITY): Payer: Medicaid Other

## 2017-01-03 ENCOUNTER — Emergency Department (HOSPITAL_COMMUNITY)
Admission: EM | Admit: 2017-01-03 | Discharge: 2017-01-03 | Disposition: A | Discharge status: Home / Self Care | Payer: Medicaid Other | Attending: Emergency Medicine | Admitting: Emergency Medicine

## 2017-01-03 DIAGNOSIS — F17210 Nicotine dependence, cigarettes, uncomplicated: Secondary | ICD-10-CM | POA: Insufficient documentation

## 2017-01-03 DIAGNOSIS — Y939 Activity, unspecified: Secondary | ICD-10-CM | POA: Diagnosis not present

## 2017-01-03 DIAGNOSIS — Y999 Unspecified external cause status: Secondary | ICD-10-CM | POA: Diagnosis not present

## 2017-01-03 DIAGNOSIS — S9001XA Contusion of right ankle, initial encounter: Secondary | ICD-10-CM | POA: Diagnosis not present

## 2017-01-03 DIAGNOSIS — Y9289 Other specified places as the place of occurrence of the external cause: Secondary | ICD-10-CM | POA: Diagnosis not present

## 2017-01-03 DIAGNOSIS — W208XXA Other cause of strike by thrown, projected or falling object, initial encounter: Secondary | ICD-10-CM | POA: Insufficient documentation

## 2017-01-03 DIAGNOSIS — S99911A Unspecified injury of right ankle, initial encounter: Secondary | ICD-10-CM | POA: Diagnosis present

## 2017-01-03 NOTE — Progress Notes (Signed)
Orthopedic Tech Progress Note Patient Details:  Vanessa Walsh [birth year] [id number]  Ortho Devices Type of Ortho Device: ASO, Crutches Ortho Device/Splint Location: applied aso to pt right ankle foot/ fitted and train pt for crutch use.  pt ambulated very well.  right ankle foot.  Ortho Device/Splint Interventions: Application, Adjustment   Alvina Chou 01/03/2017, 1:44 PM
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
## 2018-01-14 ENCOUNTER — Encounter (HOSPITAL_COMMUNITY): Payer: Self-pay | Admitting: *Deleted

## 2018-01-14 ENCOUNTER — Emergency Department (HOSPITAL_COMMUNITY)
Admission: EM | Admit: 2018-01-14 | Discharge: 2018-01-14 | Discharge status: Against Medical Advice | Payer: Medicaid Other | Attending: Emergency Medicine | Admitting: Emergency Medicine

## 2018-01-14 DIAGNOSIS — Z5321 Procedure and treatment not carried out due to patient leaving prior to being seen by health care provider: Secondary | ICD-10-CM | POA: Insufficient documentation

## 2018-01-14 DIAGNOSIS — R6883 Chills (without fever): Secondary | ICD-10-CM | POA: Insufficient documentation

## 2018-01-14 NOTE — ED Triage Notes (Signed)
Pt in c/o chills and nausea since last night, thinks she has had a fever, reports generalized body aches as well, no distress noted

## 2018-10-02 ENCOUNTER — Other Ambulatory Visit: Payer: Self-pay

## 2018-10-02 ENCOUNTER — Emergency Department (HOSPITAL_COMMUNITY)
Admission: EM | Admit: 2018-10-02 | Discharge: 2018-10-02 | Disposition: A | Discharge status: Home / Self Care | Payer: Self-pay | Attending: Emergency Medicine | Admitting: Emergency Medicine

## 2018-10-02 ENCOUNTER — Encounter (HOSPITAL_COMMUNITY): Payer: Self-pay | Admitting: Emergency Medicine

## 2018-10-02 DIAGNOSIS — Y939 Activity, unspecified: Secondary | ICD-10-CM | POA: Insufficient documentation

## 2018-10-02 DIAGNOSIS — S0990XA Unspecified injury of head, initial encounter: Secondary | ICD-10-CM

## 2018-10-02 DIAGNOSIS — F17210 Nicotine dependence, cigarettes, uncomplicated: Secondary | ICD-10-CM | POA: Insufficient documentation

## 2018-10-02 DIAGNOSIS — S01112A Laceration without foreign body of left eyelid and periocular area, initial encounter: Secondary | ICD-10-CM | POA: Insufficient documentation

## 2018-10-02 DIAGNOSIS — Y929 Unspecified place or not applicable: Secondary | ICD-10-CM | POA: Insufficient documentation

## 2018-10-02 DIAGNOSIS — S0181XA Laceration without foreign body of other part of head, initial encounter: Secondary | ICD-10-CM

## 2018-10-02 DIAGNOSIS — Y999 Unspecified external cause status: Secondary | ICD-10-CM | POA: Insufficient documentation

## 2018-10-02 MED ORDER — LIDOCAINE-EPINEPHRINE (PF) 2 %-1:200000 IJ SOLN: 10.0000 mL | Freq: Once | INTRAMUSCULAR | Status: DC

## 2018-10-02 MED ORDER — LORAZEPAM 0.5 MG PO TABS
0.5000 mg | ORAL_TABLET | Freq: Once | ORAL | Status: AC
  Administered 2018-10-02: 19:00:00 0.5 mg via ORAL
  Filled 2018-10-02: qty 1

## 2018-10-02 MED ORDER — LIDOCAINE-EPINEPHRINE-TETRACAINE (LET) SOLUTION
3.0000 mL | Freq: Once | NASAL | Status: AC
  Administered 2018-10-02: 19:00:00 3 mL via TOPICAL
  Filled 2018-10-02: qty 3
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
## 2018-10-09 ENCOUNTER — Encounter (HOSPITAL_COMMUNITY): Payer: Self-pay | Admitting: Emergency Medicine

## 2018-10-09 ENCOUNTER — Other Ambulatory Visit: Payer: Self-pay

## 2018-10-09 ENCOUNTER — Ambulatory Visit (HOSPITAL_COMMUNITY)
Admission: EM | Admit: 2018-10-09 | Discharge: 2018-10-09 | Disposition: A | Discharge status: Home / Self Care | Payer: Self-pay

## 2018-10-09 DIAGNOSIS — Z4802 Encounter for removal of sutures: Secondary | ICD-10-CM

## 2018-10-09 NOTE — ED Triage Notes (Signed)
Pt here for suture removal above left eye; 8 sutures removed and wound approximated

## 2019-04-06 ENCOUNTER — Other Ambulatory Visit: Payer: Self-pay

## 2019-04-06 ENCOUNTER — Encounter (HOSPITAL_COMMUNITY): Payer: Self-pay | Admitting: Obstetrics and Gynecology

## 2019-04-06 ENCOUNTER — Inpatient Hospital Stay (HOSPITAL_COMMUNITY)
Admission: AD | Admit: 2019-04-06 | Discharge: 2019-04-06 | Disposition: A | Discharge status: Home / Self Care | Payer: Self-pay | Attending: Obstetrics and Gynecology | Admitting: Obstetrics and Gynecology

## 2019-04-06 DIAGNOSIS — Z3201 Encounter for pregnancy test, result positive: Secondary | ICD-10-CM

## 2019-04-06 DIAGNOSIS — Z202 Contact with and (suspected) exposure to infections with a predominantly sexual mode of transmission: Secondary | ICD-10-CM

## 2019-04-06 HISTORY — DX: Depression, unspecified: F32.A

## 2019-04-06 HISTORY — DX: Anxiety disorder, unspecified: F41.9

## 2019-04-06 LAB — URINALYSIS, ROUTINE W REFLEX MICROSCOPIC
Bilirubin Urine: NEGATIVE
Glucose, UA: NEGATIVE mg/dL
Hgb urine dipstick: NEGATIVE
Ketones, ur: NEGATIVE mg/dL
Nitrite: NEGATIVE
Protein, ur: NEGATIVE mg/dL
Specific Gravity, Urine: 1.01 (ref 1.005–1.030)
pH: 7 (ref 5.0–8.0)

## 2019-04-06 LAB — POCT PREGNANCY, URINE: Preg Test, Ur: POSITIVE — AB

## 2019-04-06 MED ORDER — AZITHROMYCIN 250 MG PO TABS: 1000.0000 mg | ORAL_TABLET | Freq: Once | ORAL | Status: DC

## 2019-04-06 MED ORDER — CEFTRIAXONE SODIUM 250 MG IJ SOLR
250.0000 mg | Freq: Once | INTRAMUSCULAR | Status: AC
Start: 2019-04-06 — End: 2019-04-06
  Administered 2019-04-06: 250 mg via INTRAMUSCULAR
  Filled 2019-04-06: qty 250

## 2019-04-06 MED ORDER — LIDOCAINE HCL (PF) 1 % IJ SOLN
INTRAMUSCULAR | Status: AC
  Administered 2019-04-06: 0.9 mL via INTRAMUSCULAR
  Filled 2019-04-06: qty 5

## 2019-04-06 MED ORDER — CEFTRIAXONE SODIUM 250 MG IJ SOLR: 250.0000 mg | Freq: Once | INTRAMUSCULAR | Status: DC

## 2019-04-06 MED ORDER — AZITHROMYCIN 250 MG PO TABS
1000.0000 mg | ORAL_TABLET | Freq: Once | ORAL | Status: AC
  Administered 2019-04-06: 1000 mg via ORAL
  Filled 2019-04-06: qty 4
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
## 2019-04-06 NOTE — Discharge Instructions (Signed)

## 2019-04-06 NOTE — MAU Provider Note (Signed)
First Provider Initiated Contact with Patient 04/06/19 1459      S Ms. Vanessa Walsh is a 43 y.o. [id number] pregnant female who presents to MAU today for pregnancy confirmation as well as treatment of Gonorrhea and Chlamydia. Patient signed in with abdominal pain then verbalized to CNM that she was embarrassed to be asking for STI treatment and knew that this chief complaint would her her triaged quickly.  She denies abdominal pain, vaginal bleeding, dysuria, fever or recent illness  O BP 119/69 (BP Location: Left Arm)   Pulse 62   Temp 98.5 F (36.9 C) (Oral)   Resp 13   Ht 5\' 5"  (1.651 m)   Wt 86.4 kg   LMP 03/10/2019   SpO2 100%   BMI 31.70 kg/m  Physical Exam  Nursing note and vitals reviewed. Constitutional: She is oriented to person, place, and time. She appears well-developed and well-nourished.  Cardiovascular: Normal rate.  Respiratory: Effort normal.  GI: Soft. Bowel sounds are normal. She exhibits no distension. There is no abdominal tenderness. There is no rebound and no guarding.  Genitourinary:    Genitourinary Comments: Declined by patient   Musculoskeletal:        General: Normal range of motion.  Neurological: She is alert and oriented to person, place, and time.  Skin: Skin is warm and dry.  Psychiatric: She has a normal mood and affect. Her behavior is normal. Judgment and thought content normal.    A  pregnant female Medical screening exam complete Presumptive treatment for Gonorrhea and Chlamydia, tolerated well by patient  P Discharge from MAU in stable condition Patient plans to terminate pregnancy, declines offer of safe medications, prenatal vitamin, office information Warning signs for worsening condition that would warrant emergency follow-up discussed Patient may return to MAU as needed for pregnancy related complaints  M[place]ki [place] 04/06/2019 4:56 PM

## 2019-12-17 ENCOUNTER — Other Ambulatory Visit: Payer: Self-pay

## 2019-12-17 ENCOUNTER — Emergency Department (HOSPITAL_COMMUNITY)
Admission: EM | Admit: 2019-12-17 | Discharge: 2019-12-18 | Disposition: A | Discharge status: Home / Self Care | Payer: Self-pay | Attending: Emergency Medicine | Admitting: Emergency Medicine

## 2019-12-17 ENCOUNTER — Emergency Department (HOSPITAL_COMMUNITY): Payer: Self-pay

## 2019-12-17 DIAGNOSIS — R0602 Shortness of breath: Secondary | ICD-10-CM | POA: Insufficient documentation

## 2019-12-17 DIAGNOSIS — J449 Chronic obstructive pulmonary disease, unspecified: Secondary | ICD-10-CM | POA: Insufficient documentation

## 2019-12-17 DIAGNOSIS — Z5321 Procedure and treatment not carried out due to patient leaving prior to being seen by health care provider: Secondary | ICD-10-CM | POA: Insufficient documentation

## 2019-12-17 NOTE — ED Triage Notes (Addendum)
Pt reports cough and SOB for the last month. Reports negative COVID test recently. States that her doctor told her that she had COPD years ago. Used her family's albuterol nebulizer last night and felt much better, but does not have albuterol of her own. A&Ox4. No fever, congestion, or body aches.

## 2019-12-18 NOTE — ED Notes (Signed)
Pt returned her stickers to registration and stated that she was leaving 

## 2020-01-11 ENCOUNTER — Emergency Department (HOSPITAL_BASED_OUTPATIENT_CLINIC_OR_DEPARTMENT_OTHER)
Admission: EM | Admit: 2020-01-11 | Discharge: 2020-01-11 | Disposition: A | Discharge status: Home / Self Care | Payer: Self-pay | Attending: Emergency Medicine | Admitting: Emergency Medicine

## 2020-01-11 ENCOUNTER — Encounter (HOSPITAL_BASED_OUTPATIENT_CLINIC_OR_DEPARTMENT_OTHER): Payer: Self-pay | Admitting: *Deleted

## 2020-01-11 ENCOUNTER — Other Ambulatory Visit: Payer: Self-pay

## 2020-01-11 DIAGNOSIS — Z5321 Procedure and treatment not carried out due to patient leaving prior to being seen by health care provider: Secondary | ICD-10-CM | POA: Insufficient documentation

## 2020-01-11 DIAGNOSIS — R0602 Shortness of breath: Secondary | ICD-10-CM | POA: Insufficient documentation

## 2020-01-11 DIAGNOSIS — M791 Myalgia, unspecified site: Secondary | ICD-10-CM | POA: Insufficient documentation

## 2020-01-11 DIAGNOSIS — R5383 Other fatigue: Secondary | ICD-10-CM | POA: Insufficient documentation

## 2020-01-11 DIAGNOSIS — R059 Cough, unspecified: Secondary | ICD-10-CM | POA: Insufficient documentation

## 2020-01-11 NOTE — ED Notes (Signed)
Pt called out with complaint of increased shortness of breath, assessed lung and throat sounds all clear. PO2 100%. Pt asked about wait to see provider, told her at least an hour Pt upset over wait and stated would go to another facility. Informed patient other facility had longer wait for patents that were still in the waiting room. encouraged pt to stay here, pt declined  And LWBS

## 2020-01-11 NOTE — ED Triage Notes (Signed)
Cough, sob, fatigue, body aches x 2 months. States she knows she does not have Covid. She was tested 2 weeks ago.

## 2020-11-21 ENCOUNTER — Other Ambulatory Visit: Payer: Self-pay

## 2020-11-21 ENCOUNTER — Emergency Department (HOSPITAL_COMMUNITY)
Admission: EM | Admit: 2020-11-21 | Discharge: 2020-11-21 | Disposition: A | Discharge status: Home / Self Care | Payer: Self-pay | Attending: Emergency Medicine | Admitting: Emergency Medicine

## 2020-11-21 ENCOUNTER — Emergency Department (HOSPITAL_COMMUNITY): Payer: Self-pay

## 2020-11-21 ENCOUNTER — Encounter (HOSPITAL_COMMUNITY): Payer: Self-pay

## 2020-11-21 DIAGNOSIS — Y9301 Activity, walking, marching and hiking: Secondary | ICD-10-CM | POA: Insufficient documentation

## 2020-11-21 DIAGNOSIS — M545 Low back pain, unspecified: Secondary | ICD-10-CM | POA: Insufficient documentation

## 2020-11-21 DIAGNOSIS — Y9289 Other specified places as the place of occurrence of the external cause: Secondary | ICD-10-CM | POA: Insufficient documentation

## 2020-11-21 DIAGNOSIS — M25552 Pain in left hip: Secondary | ICD-10-CM | POA: Insufficient documentation

## 2020-11-21 DIAGNOSIS — R202 Paresthesia of skin: Secondary | ICD-10-CM | POA: Insufficient documentation

## 2020-11-21 DIAGNOSIS — M25551 Pain in right hip: Secondary | ICD-10-CM | POA: Insufficient documentation

## 2020-11-21 DIAGNOSIS — W19XXXA Unspecified fall, initial encounter: Secondary | ICD-10-CM

## 2020-11-21 DIAGNOSIS — W010XXA Fall on same level from slipping, tripping and stumbling without subsequent striking against object, initial encounter: Secondary | ICD-10-CM | POA: Insufficient documentation

## 2020-11-21 DIAGNOSIS — F17210 Nicotine dependence, cigarettes, uncomplicated: Secondary | ICD-10-CM | POA: Insufficient documentation

## 2020-11-21 LAB — POC URINE PREG, ED: Preg Test, Ur: NEGATIVE

## 2020-11-21 MED ORDER — CYCLOBENZAPRINE HCL 10 MG PO TABS: 10.0000 mg | ORAL_TABLET | Freq: Two times a day (BID) | ORAL | 0 refills | Status: DC | PRN

## 2020-11-21 MED ORDER — CYCLOBENZAPRINE HCL 10 MG PO TABS
5.0000 mg | ORAL_TABLET | Freq: Once | ORAL | Status: AC
  Administered 2020-11-21: 5 mg via ORAL
  Filled 2020-11-21: qty 1

## 2020-11-21 NOTE — ED Provider Notes (Signed)
[facility]-EMERGENCY DEPT Provider Note   CSN: [id number] Arrival date & time: 11/21/20  1841     History Chief Complaint  Patient presents with   Vanessa Walsh is a 45 y.o. female with history of chronic back pain who presents to [facility] long ED for bilateral hip pain after a fall that occurred 1 day ago.  She states that she was walking in [employer] when she slipped and fell on some oil and fell backwards onto her butt and hands.  Her right hip is worse than the left and more severe with direct palpation.  She denies hitting her head, loss of consciousness, nausea, vomiting, loss of bowel or bladder, weakness, or numbness.  She has been taking Tylenol at home with little to no improvement on her hip pain since the fall.  Since the fall, she also complains of left second finger numbness localized to the DIP.  She does not recall directly injuring the finger during the fall or any other recent injury to the finger. The finger is not painful.  She also complains of lower back pain which is similar to her chronic back pain and unchanged from baseline since the fall.   Fall Pertinent negatives include no chest pain and no headaches.      Past Medical History:  Diagnosis Date   Anxiety    Depression    Medical history non-contributory     Patient Active Problem List   Diagnosis Date Noted   STD exposure 04/06/2019    Past Surgical History:  Procedure Laterality Date   OVARIAN CYST SURGERY       OB History     Gravida  11   Para  9   Term  9   Preterm      AB  1   Living  9      SAB  1   IAB      Ectopic      Multiple      Live Births  9           History reviewed. No pertinent family history.  Social History   Tobacco Use   Smoking status: Every Day    Packs/day: 0.50    Types: Cigarettes   Smokeless tobacco: Former  Building services engineer Use: Never used  Substance Use Topics   Alcohol use: No   Drug use: No     Home Medications Prior to Admission medications   Not on File    Allergies    Ibuprofen and Tramadol  Review of Systems   Review of Systems  Cardiovascular:  Negative for chest pain.  Gastrointestinal:  Negative for constipation, diarrhea, nausea and vomiting.  Genitourinary:        No bladder incontinence  Musculoskeletal:  Negative for neck pain and neck stiffness.       Bilateral hip pain  Neurological:  Negative for dizziness and headaches.       Numbness to the left index finger   Physical Exam Updated Vital Signs BP 122/74 (BP Location: Right Arm)   Pulse 84   Temp 98.6 F (37 C) (Oral)   Resp 14   SpO2 98%   Physical Exam Constitutional:      Appearance: Normal appearance.  HENT:     Head: Normocephalic and atraumatic.  Eyes:     General:        Right eye: No discharge.  Left eye: No discharge.  Cardiovascular:     Pulses:          Dorsalis pedis pulses are 2+ on the right side and 2+ on the left side.  Pulmonary:     Effort: Pulmonary effort is normal.  Musculoskeletal:     Cervical back: No tenderness. No spinous process tenderness or muscular tenderness.     Thoracic back: No tenderness.     Lumbar back: Tenderness present. No lacerations.     Right hip: Tenderness present. Normal range of motion. Normal strength.     Left hip: Tenderness present. Normal range of motion. Normal strength.     Comments: Exam of the left second finger revealed normal range of motion, 5/5 strength, good cap refill, with decreased sensation above to the DIP joint. Sensation is normal below the DIP joint.   Skin:    General: Skin is warm and dry.     Capillary Refill: Capillary refill takes less than 2 seconds.  Neurological:     Mental Status: She is alert.     Comments: Sensation grossly intact to the bilateral lower extremities. She is neurovascularly intact. Ambulatory.     ED Results / Procedures / Treatments   Labs (all labs ordered are listed, but only  abnormal results are displayed) Labs Reviewed - No data to display  EKG None  Radiology DG Finger Index Left  Result Date: 11/21/2020 CLINICAL DATA:  Fall EXAM: LEFT INDEX FINGER 2+V COMPARISON:  None. FINDINGS: No fracture or dislocation is seen. The joint spaces are preserved. Visualized soft tissues are within normal limits. IMPRESSION: Negative. Electronically Signed   By: Charline Bills M.D.   On: 11/21/2020 20:50   DG Hips Bilat W or Wo Pelvis 3-4 Views  Result Date: 11/21/2020 CLINICAL DATA:  Fall EXAM: DG HIP (WITH OR WITHOUT PELVIS) 3-4V BILAT COMPARISON:  None. FINDINGS: No fracture or dislocation is seen. The joint spaces are preserved. Visualized bony pelvis appears intact. IMPRESSION: Negative. Electronically Signed   By: Charline Bills M.D.   On: 11/21/2020 20:49    Procedures Procedures   Medications Ordered in ED Medications - No data to display  ED Course  I have reviewed the triage vital signs and the nursing notes.  Pertinent labs & imaging results that were available during my care of the patient were reviewed by me and considered in my medical decision making (see chart for details).  Clinical Course as of 11/21/20 2057  Tue Nov 21, 2020  2054 DG Finger Index Left [CF]    Clinical Course User Index [CF] Teressa Lower, PA-C   MDM Rules/Calculators/A&P                           Ms. Vanessa Walsh is a 45 year old female with history of chronic low back pain who presents to [facility] emergency department for bilateral hip pain and left distal index finger numbness after a subsequent fall in [employer] yesterday.  The patient did not hit her head nor did she lose consciousness during this fall as she fell backwards onto her buttocks and hands.  She was more tender on the right hip than the left and got plain films of both.  In addition, she was complaining of subjective numbness to the left second DIP.  Skin was warm to the touch and had good cap refill.   There was full range of motion and no tenderness on exam. Imaging  of the digit was ordered.   Bilateral hip X-ray: Negative for any acute fracture or pathology. Left index finger X-ray: Negative for any acute fracture or pathology.  Patient was complaining of pain and 5 mg of cyclobenzaprine was given as she is allergic to NSAIDs and has been taking Tylenol at home without relief.  She confirmed that her husband is going to give her a ride home.  Clinically, there are no signs of serious trauma to the head, neck, back, or hips. There are no open wounds and she is neurovascularly intact. She did have midline lumbar tenderness which is unchanged from her baseline prior to the fall. She has suffered from chronic lower back pain for some time and the pain today feels the same. She did not have any bowel or bladder incontinence since the fall. She has good ROM, strength, and sensation to the bilateral lower extremities. X-rays of the hips are negative. In regards to her left second finger, X-ray does not show fracture or dislocation, sensation is intact to light touch but subjectively diminished at the DIP but the patient has intact strength with good cap refill. Possible soft tissue contusion and swelling could be causing the numbness. However, she appropriately looks well for discharge but will provide orthopedic follow-up if symptoms persist or get worse. Final Clinical Impression(s) / ED Diagnoses Final diagnoses:  Fall, initial encounter    Rx / DC Orders ED Discharge Orders     None        Jolyn Lent 11/21/20 2121    Arby Barrette, MD 12/09/20 1437

## 2020-11-21 NOTE — Discharge Instructions (Addendum)
You were seen and evaluated in the emergency department today for bilateral hip pain and left finger numbness after a fall.  Imaging was reassuring as there was no fracture to your hips and finger.  You likely sustained a bruise to the soft tissue on your hips. Please see RICE therapy attachment to do for your hip pain. Flexeril was given as needed for muscle pain and spasms. DO NOT drive or operate heavy machinery with this medication. DO NOT mix with alcohol or other sedating medications. Your finger numbness could be from a bruise from the fall. There was no evidence of fracture on X-ray. I provided a orthopedic follow up for you for further evaluation. Please return to the ED if you are experiencing worsening or severe pain, loss of bowel or bladder function, worsening numbness to the bilateral legs or any other concerns.

## 2020-11-21 NOTE — ED Triage Notes (Signed)
Pt slipped and fell at [employer] yesterday and now endorses bilateral hip pain and left finger numbness.

## 2021-05-28 ENCOUNTER — Emergency Department (HOSPITAL_BASED_OUTPATIENT_CLINIC_OR_DEPARTMENT_OTHER)
Admission: EM | Admit: 2021-05-28 | Discharge: 2021-05-28 | Disposition: A | Discharge status: Home / Self Care | Payer: Self-pay | Attending: Emergency Medicine | Admitting: Emergency Medicine

## 2021-05-28 ENCOUNTER — Other Ambulatory Visit: Payer: Self-pay

## 2021-05-28 ENCOUNTER — Encounter (HOSPITAL_BASED_OUTPATIENT_CLINIC_OR_DEPARTMENT_OTHER): Payer: Self-pay | Admitting: *Deleted

## 2021-05-28 DIAGNOSIS — N390 Urinary tract infection, site not specified: Secondary | ICD-10-CM | POA: Insufficient documentation

## 2021-05-28 DIAGNOSIS — A599 Trichomoniasis, unspecified: Secondary | ICD-10-CM | POA: Insufficient documentation

## 2021-05-28 LAB — URINALYSIS, MICROSCOPIC (REFLEX)

## 2021-05-28 LAB — URINALYSIS, ROUTINE W REFLEX MICROSCOPIC
Bilirubin Urine: NEGATIVE
Glucose, UA: NEGATIVE mg/dL
Ketones, ur: NEGATIVE mg/dL
Nitrite: NEGATIVE
Protein, ur: NEGATIVE mg/dL
Specific Gravity, Urine: 1.025 (ref 1.005–1.030)
pH: 6 (ref 5.0–8.0)

## 2021-05-28 LAB — PREGNANCY, URINE: Preg Test, Ur: NEGATIVE

## 2021-05-28 MED ORDER — METRONIDAZOLE 500 MG PO TABS
2000.0000 mg | ORAL_TABLET | Freq: Once | ORAL | Status: AC
  Administered 2021-05-28: 2000 mg via ORAL
  Filled 2021-05-28: qty 4

## 2021-05-28 MED ORDER — CEPHALEXIN 500 MG PO CAPS: 500.0000 mg | ORAL_CAPSULE | Freq: Two times a day (BID) | ORAL | 0 refills | Status: AC

## 2021-05-28 NOTE — ED Provider Notes (Signed)
[facility] EMERGENCY DEPARTMENT  Provider Note  CSN: [id number] Arrival date & time: 05/28/21 1932  History Chief Complaint  Patient presents with   Dysuria    Vanessa Walsh is a 46 y.o. female reports about a week of pain with urination, not described as burning without fever, vomiting or flank pain. Not improved with AZO. She has not had any vaginal discharge but has had some cramping she thought was her menses starting. She was last sexually active about a month ago.   Home Medications Prior to Admission medications   Medication Sig Start Date End Date Taking? Authorizing Provider  cephALEXin (KEFLEX) 500 MG capsule Take 1 capsule (500 mg total) by mouth 2 (two) times daily for 7 days. 05/28/21 06/04/21 Yes Pollyann Savoy, MD  cyclobenzaprine (FLEXERIL) 10 MG tablet Take 1 tablet (10 mg total) by mouth 2 (two) times daily as needed for muscle spasms. 11/21/20   Teressa Lower, PA-C     Allergies    Ibuprofen and Tramadol   Review of Systems   Review of Systems Please see HPI for pertinent positives and negatives  Physical Exam BP 120/73    Pulse (!) 103    Temp 98 F (36.7 C) (Oral)    Resp 20    Ht 5\' 5"  (1.651 m)    Wt 95.3 kg    SpO2 96%    BMI 34.95 kg/m   Physical Exam Vitals and nursing note reviewed.  Constitutional:      Appearance: Normal appearance.  HENT:     Head: Normocephalic and atraumatic.     Nose: Nose normal.     Mouth/Throat:     Mouth: Mucous membranes are moist.  Eyes:     Extraocular Movements: Extraocular movements intact.     Conjunctiva/sclera: Conjunctivae normal.  Cardiovascular:     Rate and Rhythm: Normal rate.  Pulmonary:     Effort: Pulmonary effort is normal.     Breath sounds: Normal breath sounds.  Abdominal:     General: Abdomen is flat.     Palpations: Abdomen is soft.     Tenderness: There is no abdominal tenderness.  Musculoskeletal:        General: No swelling. Normal range of motion.     Cervical  back: Neck supple.  Skin:    General: Skin is warm and dry.  Neurological:     General: No focal deficit present.     Mental Status: She is alert.  Psychiatric:        Mood and Affect: Mood normal.    ED Results / Procedures / Treatments   EKG None  Procedures Procedures  Medications Ordered in the ED Medications  metroNIDAZOLE (FLAGYL) tablet 2,000 mg (2,000 mg Oral Given 05/28/21 2117)    Initial Impression and Plan  Patient here with LUTS, noted to have signs of infection as well as trichomonas on UA. She has had trichomonas infection before but it has been several years. Unclear if she is an asymptomatic carrier or if this is the cause of her pain today. She will self-swab for GC/CT. Will treat with Flagyl PO in the ED and Rx for Keflex to cover possible UTI as well.   ED Course       MDM Rules/Calculators/A&P Medical Decision Making Problems Addressed: Lower urinary tract infectious disease: acute illness or injury Trichomonas infection: acute illness or injury  Amount and/or Complexity of Data Reviewed Labs: ordered. Decision-making details documented in ED Course.  Risk Prescription drug management.    Final Clinical Impression(s) / ED Diagnoses Final diagnoses:  Lower urinary tract infectious disease  Trichomonas infection    Rx / DC Orders ED Discharge Orders          Ordered    cephALEXin (KEFLEX) 500 MG capsule  2 times daily        05/28/21 2157             Pollyann Savoy, MD 05/28/21 2157

## 2021-05-28 NOTE — ED Triage Notes (Signed)
Dysuria x 1 week. She has been taking OTC AZO.

## 2021-05-29 LAB — GC/CHLAMYDIA PROBE AMP (~~LOC~~) NOT AT ARMC
Chlamydia: NEGATIVE
Comment: NEGATIVE
Comment: NORMAL
Neisseria Gonorrhea: NEGATIVE

## 2023-01-29 IMAGING — CR DG HIP (WITH OR WITHOUT PELVIS) 3-4V BILAT
5 series · 5 of 5 positions shown · non-contrast
Comparison: None.

CLINICAL DATA: Fall

EXAM:
DG HIP (WITH OR WITHOUT PELVIS) 3-4V BILAT

[t pelvis ap]
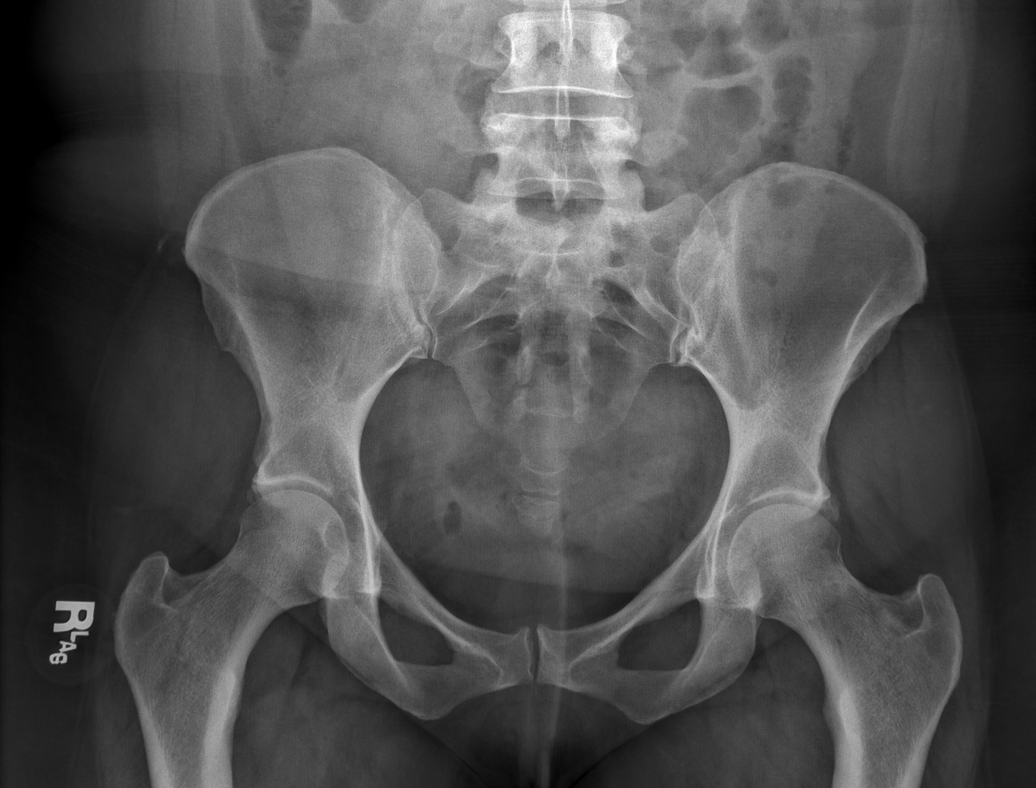

[t hip ap left]
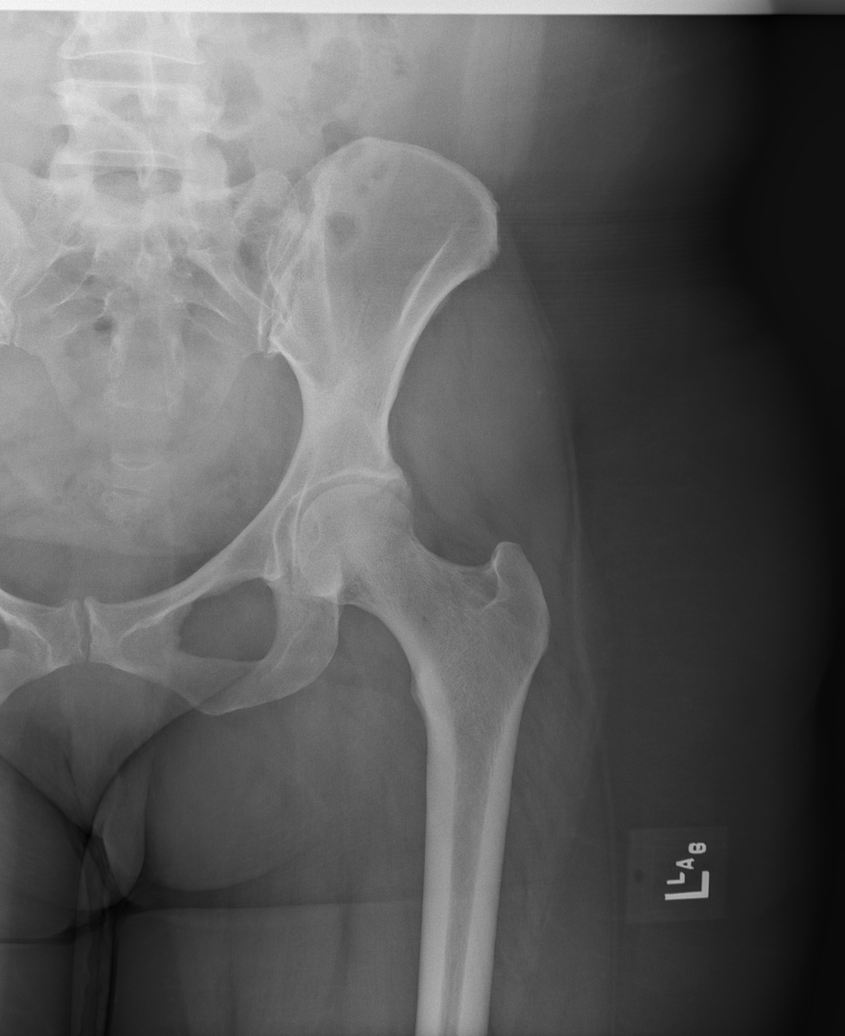

[t hip frog leg left]
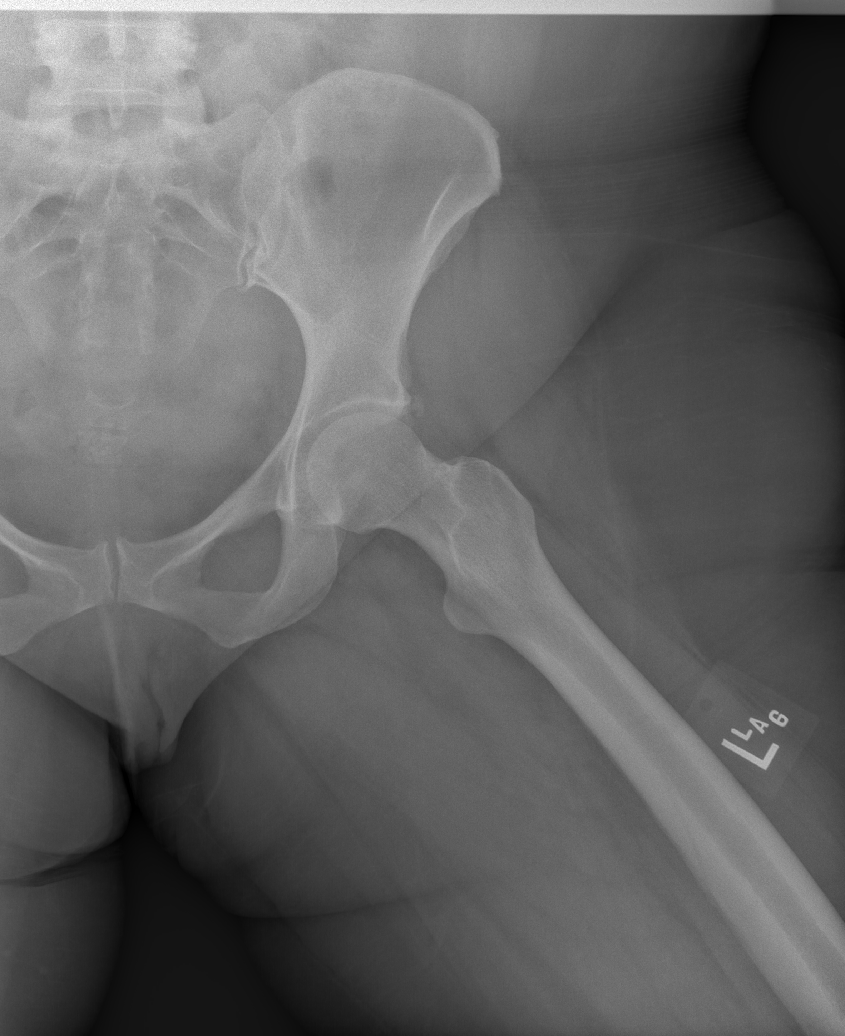

[t hip ap right]
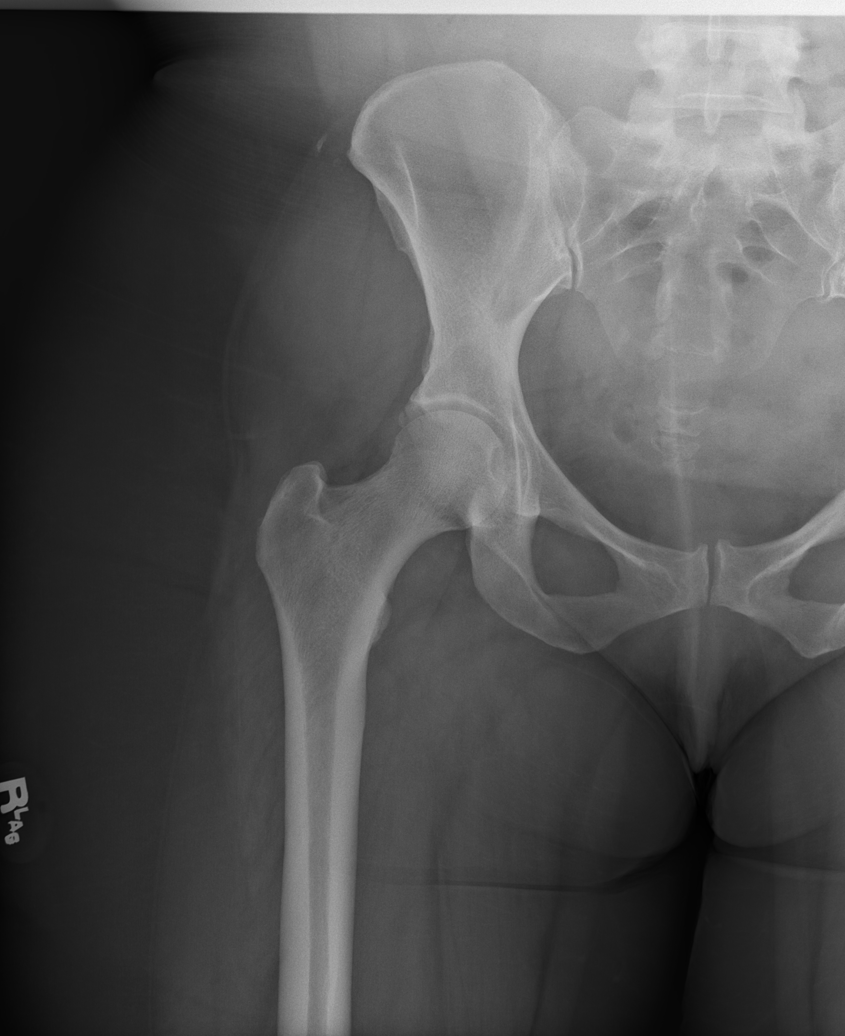

[t hip frog leg right]
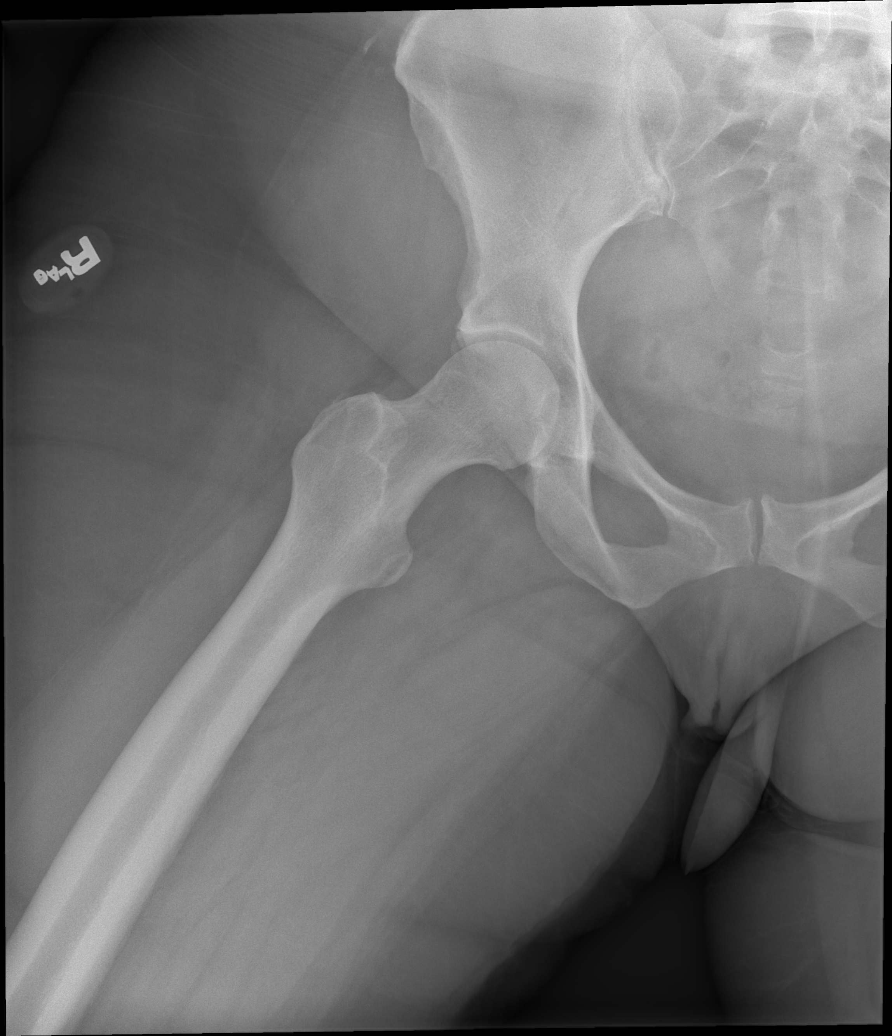

[5 of 5 positions shown; findings below may reference images not displayed]

FINDINGS: No fracture or dislocation is seen.

The joint spaces are preserved.

Visualized bony pelvis appears intact.
IMPRESSION: Negative.

## 2023-07-11 ENCOUNTER — Other Ambulatory Visit: Payer: Self-pay

## 2023-07-11 ENCOUNTER — Encounter (HOSPITAL_COMMUNITY): Payer: Self-pay

## 2023-07-11 ENCOUNTER — Emergency Department (HOSPITAL_COMMUNITY): Payer: Self-pay

## 2023-07-11 ENCOUNTER — Emergency Department (HOSPITAL_COMMUNITY)
Admission: EM | Admit: 2023-07-11 | Discharge: 2023-07-11 | Disposition: A | Discharge status: Home / Self Care | Payer: Self-pay | Attending: Emergency Medicine | Admitting: Emergency Medicine

## 2023-07-11 DIAGNOSIS — M542 Cervicalgia: Secondary | ICD-10-CM | POA: Insufficient documentation

## 2023-07-11 DIAGNOSIS — W19XXXA Unspecified fall, initial encounter: Secondary | ICD-10-CM | POA: Insufficient documentation

## 2023-07-11 DIAGNOSIS — R519 Headache, unspecified: Secondary | ICD-10-CM | POA: Insufficient documentation

## 2023-07-11 DIAGNOSIS — G8929 Other chronic pain: Secondary | ICD-10-CM

## 2023-07-11 DIAGNOSIS — M79644 Pain in right finger(s): Secondary | ICD-10-CM | POA: Insufficient documentation

## 2023-07-11 MED ORDER — OXYCODONE-ACETAMINOPHEN 5-325 MG PO TABS
1.0000 | ORAL_TABLET | Freq: Once | ORAL | Status: AC
  Administered 2023-07-11: 1 via ORAL
  Filled 2023-07-11: qty 1

## 2023-07-11 NOTE — ED Provider Notes (Signed)
 Accepted handoff at shift change from [place], [place]. Please see prior provider note for more detail.   Briefly: Patient is 48 y.o.   DDX: concern for SAH, head injury, concussion, mechanical fall  Plan: Pending CT head and cervical spine for disposition.   Physical Exam  BP (!) 124/92   Pulse 96   Temp 98.9 F (37.2 C)   Resp 20   Ht 5\' 5"  (1.651 m)   Wt 95 kg   SpO2 100%   BMI 34.85 kg/m   Physical Exam Vitals and nursing note reviewed.  Constitutional:      General: She is not in acute distress.    Appearance: She is not ill-appearing or toxic-appearing.  HENT:     Head: Normocephalic and atraumatic.     Mouth/Throat:     Mouth: Mucous membranes are moist.     Pharynx: No oropharyngeal exudate or posterior oropharyngeal erythema.  Eyes:     General: No scleral icterus.       Right eye: No discharge.        Left eye: No discharge.     Conjunctiva/sclera: Conjunctivae normal.  Cardiovascular:     Rate and Rhythm: Normal rate and regular rhythm.     Pulses: Normal pulses.     Heart sounds: Normal heart sounds. No murmur heard. Pulmonary:     Effort: Pulmonary effort is normal. No respiratory distress.     Breath sounds: Normal breath sounds. No wheezing, rhonchi or rales.  Abdominal:     General: Abdomen is flat. Bowel sounds are normal. There is no distension.     Palpations: Abdomen is soft. There is no mass.     Tenderness: There is no abdominal tenderness.  Musculoskeletal:     Right lower leg: No edema.     Left lower leg: No edema.     Comments: No tenderness of shoulder, elbows, wrists, hips, knees, ankles bilaterally. Full active ROM.  Tenderness to palpation of right thumb.   Skin:    General: Skin is warm and dry.     Findings: No rash.  Neurological:     General: No focal deficit present.     Mental Status: She is alert and oriented to person, place, and time. Mental status is at baseline.     Comments: GCS 15. Speech is goal oriented. No  deficits appreciated to CN III-XII. Sensation to light touch intact. Patient moves extremities without ataxia. Patient flailing her arms and legs while expressing where she hurts. 5/5 strength in BL LE and UE.   Psychiatric:        Mood and Affect: Mood normal.     Procedures  Procedures  ED Course / MDM    Medical Decision Making Amount and/or Complexity of Data Reviewed Radiology: ordered.  Risk Prescription drug management.   Patient CT head and cervical spine are negative for any acute findings.  I suspect patient's symptoms are likely due to impact of the fall that she sustained.  Encourage patient continue managing her pain as needed with Tylenol and ibuprofen.  Return precautions discussed.  Patient otherwise stable for outpatient follow-up and discharged home.       Smitty Knudsen, PA-C 07/11/23 1919    Lonell Grandchild, MD 07/11/23 2043

## 2023-07-11 NOTE — ED Notes (Signed)
 Patient screaming at staff and using profanity. Patient stating that staff has "done nothing for me. Y'all haven't done shit. I don't give a fuck what my scans show. I am hurting" Patient declined d/c vitals and walked out of department with paperwork. Patient stated she was going to [employer]

## 2023-07-11 NOTE — ED Provider Notes (Signed)
  EMERGENCY DEPARTMENT AT [facility] Provider Note   CSN: [id number] Arrival date & time: 07/11/23  1217     History  Chief Complaint  Patient presents with   Vanessa Loft LACI Walsh is a 48 y.o. female with PMHx anxiety, depression who presents to ED concerned for mechanical fall. Patient slipped and fell on chicken grease at [facility] Patient stating that she feel straight onto her back and hit her head. Patient stating that she also extended her arms behind her to catch herself. Patient concerned for pain all over, but states that the pain is greatest in her head, neck, and right thumb.  Denies LOC and blood thinners.   Fall Associated symptoms include headaches.       Home Medications Prior to Admission medications   Not on File      Allergies    Ibuprofen and Tramadol    Review of Systems   Review of Systems  Neurological:  Positive for headaches.    Physical Exam Updated Vital Signs BP (!) 124/92   Pulse 96   Temp 98.9 F (37.2 C)   Resp 20   Ht 5\' 5"  (1.651 m)   Wt 95 kg   SpO2 100%   BMI 34.85 kg/m  Physical Exam Vitals and nursing note reviewed.  Constitutional:      General: She is not in acute distress.    Appearance: She is not ill-appearing or toxic-appearing.  HENT:     Head: Normocephalic and atraumatic.     Mouth/Throat:     Mouth: Mucous membranes are moist.     Pharynx: No oropharyngeal exudate or posterior oropharyngeal erythema.  Eyes:     General: No scleral icterus.       Right eye: No discharge.        Left eye: No discharge.     Conjunctiva/sclera: Conjunctivae normal.  Cardiovascular:     Rate and Rhythm: Normal rate and regular rhythm.     Pulses: Normal pulses.     Heart sounds: Normal heart sounds. No murmur heard. Pulmonary:     Effort: Pulmonary effort is normal. No respiratory distress.     Breath sounds: Normal breath sounds. No wheezing, rhonchi or rales.  Abdominal:     General: Abdomen is flat.  Bowel sounds are normal. There is no distension.     Palpations: Abdomen is soft. There is no mass.     Tenderness: There is no abdominal tenderness.  Musculoskeletal:     Right lower leg: No edema.     Left lower leg: No edema.     Comments: No tenderness of shoulder, elbows, wrists, hips, knees, ankles bilaterally. Full active ROM.  Tenderness to palpation of right thumb.   Skin:    General: Skin is warm and dry.     Findings: No rash.  Neurological:     General: No focal deficit present.     Mental Status: She is alert and oriented to person, place, and time. Mental status is at baseline.     Comments: GCS 15. Speech is goal oriented. No deficits appreciated to CN III-XII. Sensation to light touch intact. Patient moves extremities without ataxia. Patient flailing her arms and legs while expressing where she hurts. 5/5 strength in BL LE and UE.   Psychiatric:        Mood and Affect: Mood normal.     ED Results / Procedures / Treatments   Labs (all labs ordered are  listed, but only abnormal results are displayed) Labs Reviewed - No data to display  EKG None  Radiology DG Hand Complete Right Result Date: 07/11/2023 CLINICAL DATA:  Fall. EXAM: RIGHT HAND - COMPLETE 3+ VIEW COMPARISON:  None Available. FINDINGS: No acute fracture or dislocation. No aggressive osseous lesion. No significant arthritis of the wrist joint. No radiopaque foreign bodies. Soft tissues are within normal limits. IMPRESSION: No acute osseous abnormality of the right hand. Electronically Signed   By: Jules Schick M.D.   On: 07/11/2023 14:07    Procedures Procedures    Medications Ordered in ED Medications  oxyCODONE-acetaminophen (PERCOCET/ROXICET) 5-325 MG per tablet 1 tablet (1 tablet Oral Given 07/11/23 1321)    ED Course/ Medical Decision Making/ A&P                                 Medical Decision Making Amount and/or Complexity of Data Reviewed Radiology: ordered.  Risk Prescription drug  management.   This patient presents to the ED after a fall, this involves an extensive number of treatment options, and is a complaint that carries with it a high risk of complications and morbidity.  The differential diagnosis includes  intracranial hemorrhage, subdural/epidural hematoma, vertebral fracture, spinal cord injury, muscle strain, skull fracture, fracture.   Co morbidities that complicate the patient evaluation  none   Additional history obtained:  No PCP in chart.   Problem List / ED Course / Critical interventions / Medication management  Patient presented for mechanical fall. Patient with stable vitals and does not appear to be in distress. Physical exam with tenderness to palpation of right thumb - rest of physical/neuro exam unremarkable. I ordered imaging studies including right hand xray, CT head/cervical spine . Hand xray without acute process. CT imaging pending. I have reviewed the patients home medicines and have made adjustments as needed   Social Determinants of Health:  none   3PM Care of Kodi S Gillies transferred to PA Zelaya at the end of my shift as the patient will require reassessment once labs/imaging have resulted. Patient presentation, ED course, and plan of care discussed with review of all pertinent labs and imaging. Please see his/her note for further details regarding further ED course and disposition. Plan at time of handoff is reassess patient after CT head/cervical spine imaging. I expect that patient should be eligible for outpatient PCP follow up if imaging is reassuring. This may be altered or completely changed at the discretion of the oncoming team pending results of further workup.         Final Clinical Impression(s) / ED Diagnoses Final diagnoses:  Fall, initial encounter    Rx / DC Orders ED Discharge Orders     None         Margarita Rana 07/11/23 1504    Benjiman Core, MD 07/12/23 1535

## 2023-07-11 NOTE — ED Notes (Signed)
 Patient verbalized to staff that she does not like this hospital and she thought she was at [facility]. Patient remains in ED at this time speaking to registration.

## 2023-07-11 NOTE — ED Notes (Signed)
 Pt accusing nursing staff of being "racist" towards her and "discriminating".

## 2023-07-11 NOTE — ED Triage Notes (Signed)
 Patient went to [facility] for lunch and slipped and fell on some chicken grease on the floor. Patient hit back of head. No LOC and no thinners. Patient complains of headache, neck pain, and left arm pain.

## 2023-07-11 NOTE — Discharge Instructions (Signed)
 You are seen in the emergency department today for concerns of a fall.  Your CT imaging of your head and neck as well as your right hand x-ray were thankfully negative without any acute findings seen.  You will likely continue experiencing some soreness over the next 2 days from the impact of her fall.  You may continue to take Tylenol or ibuprofen as needed for headaches.  With your feelings of an unstable joint in the right thumb, I would recommend following up with a hand surgeon for further assessment of this injury if you feel that your symptoms are not improving.  Return the emergency department for any concerns or new or worsening symptoms.

## 2023-07-11 NOTE — ED Notes (Signed)
 Pt calling out in the hallway that she would like her discharge paper work now.

## 2023-07-11 NOTE — Progress Notes (Signed)
 Orthopedic Tech Progress Note Patient Details:  Vanessa Walsh March 02, 1976 [id number]  Ortho Devices Type of Ortho Device: Thumb velcro splint Ortho Device/Splint Location: RUE Ortho Device/Splint Interventions: Ordered, Adjustment, Application   Post Interventions Patient Tolerated: Well Instructions Provided: Adjustment of device  Amanda Steuart A Erhardt Dada 07/11/2023, 2:45 PM

## 2023-07-12 ENCOUNTER — Encounter (HOSPITAL_COMMUNITY): Payer: Self-pay

## 2023-07-12 ENCOUNTER — Other Ambulatory Visit: Payer: Self-pay

## 2023-07-12 ENCOUNTER — Emergency Department (HOSPITAL_COMMUNITY)
Admission: EM | Admit: 2023-07-12 | Discharge: 2023-07-13 | Disposition: A | Discharge status: Home / Self Care | Payer: Self-pay | Attending: Emergency Medicine | Admitting: Emergency Medicine

## 2023-07-12 DIAGNOSIS — W19XXXD Unspecified fall, subsequent encounter: Secondary | ICD-10-CM

## 2023-07-12 DIAGNOSIS — W19XXXA Unspecified fall, initial encounter: Secondary | ICD-10-CM | POA: Insufficient documentation

## 2023-07-12 DIAGNOSIS — M79644 Pain in right finger(s): Secondary | ICD-10-CM | POA: Insufficient documentation

## 2023-07-12 NOTE — ED Triage Notes (Signed)
 Pt arrived via EMS from home w/ c/o of increasing pain in back and neck. Numbness in both legs and left hand. Pt had a fall yesterday at [facility] in which she went to [facility] for. Pt reports falling tonight due to numbness in her legs. Denies hitting head or losing consciousness. Landed on butt. Pt still wearing neck brace from original fall.

## 2023-07-13 ENCOUNTER — Telehealth: Payer: Self-pay

## 2023-07-13 MED ORDER — OXYCODONE-ACETAMINOPHEN 5-325 MG PO TABS
1.0000 | ORAL_TABLET | Freq: Once | ORAL | Status: AC
  Administered 2023-07-13: 1 via ORAL
  Filled 2023-07-13: qty 1

## 2023-07-13 MED ORDER — OXYCODONE-ACETAMINOPHEN 5-325 MG PO TABS: 1.0000 | ORAL_TABLET | Freq: Three times a day (TID) | ORAL | 0 refills | Status: AC | PRN

## 2023-07-13 NOTE — Telephone Encounter (Signed)
 Vanessa Walsh left a message that her medicine did not get to the pharmacy. Called the patient back, left Vm with direct number.

## 2023-07-13 NOTE — ED Provider Notes (Signed)
 [place] EMERGENCY DEPARTMENT AT [facility] Provider Note   CSN: [id number] Arrival date & time: 07/12/23  2308     History  Chief Complaint  Patient presents with   Vanessa Walsh is a 48 y.o. female with no significant past medical history presents the ED today for persistent pain after fall.  Patient was seen yesterday at [facility] ED a fall at a [facility] but is here because her pain has not improved.  She said that she took ibuprofen twice without any improvement so she tried taking thing for pain.  At first, patient told me she had a fall since she was seen in the ED yesterday then she said that she only fell prior to being seen and has not had any falls since she was discharged.  States that she continues to have pain to the neck and right lower back.  States that her right thumb keeps popping out of place and she has to pop it back in.  She was given a splint yesterday but has not been wearing it because it causes numbness in her hands.  At the time of evaluation, patient is not wearing the splint.  She states that she has some numbness in her left arm but she thinks it is due to the fall, since she hit her left elbow.  Denies any numbness or pain in the legs.  No additional complaints or concerns at this time.    Home Medications Prior to Admission medications   Medication Sig Start Date End Date Taking? Authorizing Provider  oxyCODONE-acetaminophen (PERCOCET/ROXICET) 5-325 MG tablet Take 1 tablet by mouth every 8 (eight) hours as needed for up to 3 days for severe pain (pain score 7-10). 07/13/23 07/16/23 Yes Maxwell Marion, PA-C      Allergies    Ibuprofen and Tramadol    Review of Systems   Review of Systems  Musculoskeletal:  Positive for arthralgias.  All other systems reviewed and are negative.   Physical Exam Updated Vital Signs BP 128/69   Pulse 72   Temp 98.3 F (36.8 C) (Oral)   Resp 17   SpO2 94%  Physical Exam Vitals and nursing note reviewed.   Constitutional:      General: She is not in acute distress.    Appearance: Normal appearance.  HENT:     Head: Normocephalic and atraumatic.     Mouth/Throat:     Mouth: Mucous membranes are moist.  Eyes:     Conjunctiva/sclera: Conjunctivae normal.     Pupils: Pupils are equal, round, and reactive to light.  Neck:     Comments: Tenderness to palpation of the paravertebral muscles of the cervical spine.  No midline tenderness, step-off, or deformity.  On evaluation, patient maintains full range of motion of her neck. Cardiovascular:     Rate and Rhythm: Normal rate and regular rhythm.     Pulses: Normal pulses.     Heart sounds: Normal heart sounds.  Pulmonary:     Effort: Pulmonary effort is normal.     Breath sounds: Normal breath sounds.  Abdominal:     Palpations: Abdomen is soft.     Tenderness: There is no abdominal tenderness.  Musculoskeletal:        General: Tenderness present. Normal range of motion.     Cervical back: Normal range of motion. Tenderness present.     Right lower leg: No edema.     Left lower leg: No edema.  Comments: Tenderness to palpation of the right sided paravertebral muscles of the lumbar spine.  Tenderness to palpation of the right thumb without swelling or erythema.  Capillary refills intact.  Range of motion, strength, and sensation of upper and lower extremities intact bilaterally.  Skin:    General: Skin is warm and dry.     Findings: No rash.  Neurological:     General: No focal deficit present.     Mental Status: She is alert.     Sensory: No sensory deficit.     Motor: No weakness.  Psychiatric:        Mood and Affect: Mood normal.        Behavior: Behavior normal.    ED Results / Procedures / Treatments   Labs (all labs ordered are listed, but only abnormal results are displayed) Labs Reviewed - No data to display  EKG None  Radiology CT Cervical Spine Wo Contrast Result Date: 07/11/2023 CLINICAL DATA:  Slipped and fell,  hit back of head, neck pain EXAM: CT CERVICAL SPINE WITHOUT CONTRAST TECHNIQUE: Multidetector CT imaging of the cervical spine was performed without intravenous contrast. Multiplanar CT image reconstructions were also generated. RADIATION DOSE REDUCTION: This exam was performed according to the departmental dose-optimization program which includes automated exposure control, adjustment of the mA and/or kV according to patient size and/or use of iterative reconstruction technique. COMPARISON:  None Available. FINDINGS: Alignment: Patient's head is rotated to the right. Alignment of the cervical vertebral bodies is grossly anatomic. Skull base and vertebrae: No acute fracture. No primary bone lesion or focal pathologic process. Soft tissues and spinal canal: No prevertebral fluid or swelling. No visible canal hematoma. Disc levels: Moderate spondylosis at C5-6 and C6-7. Mild multilevel facet hypertrophy greatest at C2-3 and C3-4. Upper chest: Airway is patent. Mild emphysema at the lung apices. Otherwise the lungs are clear. Other: Reconstructed images demonstrate no additional findings. IMPRESSION: 1. No acute cervical spine fracture. 2. Multilevel spondylosis and facet hypertrophy as above. Electronically Signed   By: Sharlet Salina M.D.   On: 07/11/2023 15:27   CT Head Wo Contrast Result Date: 07/11/2023 CLINICAL DATA:  Trauma EXAM: CT HEAD WITHOUT CONTRAST TECHNIQUE: Contiguous axial images were obtained from the base of the skull through the vertex without intravenous contrast. RADIATION DOSE REDUCTION: This exam was performed according to the departmental dose-optimization program which includes automated exposure control, adjustment of the mA and/or kV according to patient size and/or use of iterative reconstruction technique. COMPARISON:  Head CT 12/04/2013 FINDINGS: Brain: No evidence of acute infarction, hemorrhage, hydrocephalus, extra-axial collection or mass lesion/mass effect. Vascular: No hyperdense  vessel or unexpected calcification. Skull: Normal. Negative for fracture or focal lesion. Sinuses/Orbits: No acute finding. Other: None. IMPRESSION: No acute intracranial abnormality. Electronically Signed   By: Darliss Cheney M.D.   On: 07/11/2023 15:25   DG Hand Complete Right Result Date: 07/11/2023 CLINICAL DATA:  Fall. EXAM: RIGHT HAND - COMPLETE 3+ VIEW COMPARISON:  None Available. FINDINGS: No acute fracture or dislocation. No aggressive osseous lesion. No significant arthritis of the wrist joint. No radiopaque foreign bodies. Soft tissues are within normal limits. IMPRESSION: No acute osseous abnormality of the right hand. Electronically Signed   By: Jules Schick M.D.   On: 07/11/2023 14:07    Procedures Procedures: not indicated.   Medications Ordered in ED Medications  oxyCODONE-acetaminophen (PERCOCET/ROXICET) 5-325 MG per tablet 1 tablet (1 tablet Oral Given 07/13/23 0247)    ED Course/ Medical Decision Making/ A&P  Medical Decision Making Risk Prescription drug management.   This patient presents to the ED for concern of pain after fall, this involves an extensive number of treatment options, and is a complaint that carries with it a high risk of complications and morbidity.   Differential diagnosis includes: Fracture, dislocation, contusion, abrasion, drug seeking, etc.   Comorbidities  No significant past medical history   Additional History  Additional history obtained from ED records from yesterday   Problem List / ED Course / Critical Interventions / Medication Management  Patient reports that she fell yesterday at [facility] and landed on her buttocks.  She states she hit her head and neck in the process.  Also reported pain to right hand.  Was seen at [facility] ED yesterday after the incident occurred and had imaging done.  Imaging was reassuring did not show any acute abnormalities.  Was given a right wrist splint and advised to follow-up  with orthopedics for further evaluation. Patient is here today because pain is persistent.  She is not wearing her wrist splint and states that she is having pain in her thumb.  States that it is "popping out of place" and she has to pop it back in place.  States that she was told yesterday that she could have tore a ligament in the thumb and was advised to wear the splint as it adds protection to the thumb.  She is not taking any OTC analgesics as instructed for the pain.  She states that ibuprofen does not help her.  No focal neurologic deficits. I asked her what she would like [place] to do in the ED tonight.  States that she does not want any new imaging because she does not think that the finger is broken. Also endorses pain to the left elbow, stating that she hit it when she fell and has some numbness to the left upper extremity.  She maintains full range of motion of the elbow and does not have any gross deformities.  She also states that she does not think she broke dislocated it during the fall yesterday.  Has the same neck pain as yesterday as well as right low back pain.  Was wearing cervical collar in triage but is not wearing at the time my evaluation.  She maintains full range of motion of her neck on examination. I offered to repeat imaging if that is what patient was coming here for.  Patient declined imaging states that she does not think she has any broken bones but she is in pain. I ordered medications including: Percocet for pain - given prior to discharge I have reviewed the patients home medicines and have made adjustments as needed.   Social Determinants of Health  Physical activity   Test / Admission - Considered  Reviewed imaging results from yesterday with patient.  All questions were answered. She is stable and safe for discharge home. Return precautions given.       Final Clinical Impression(s) / ED Diagnoses Final diagnoses:  Thumb pain, right  Fall, subsequent  encounter    Rx / DC Orders ED Discharge Orders          Ordered    oxyCODONE-acetaminophen (PERCOCET/ROXICET) 5-325 MG tablet  Every 8 hours PRN        07/13/23 0244              Maxwell Marion, PA-C 07/13/23 0301    Zadie Rhine, MD 07/13/23 [phone]

## 2023-07-13 NOTE — Discharge Instructions (Signed)
 As discussed, alternate between Ibuprofen 600 mg and Tylenol 650 mg every 4 hours.  I have sent a prescription of Percocet to your pharmacy.  You can take this every 8 hours as needed for breakthrough pain.  This medication can cause drowsiness so do not drive or operate heavy machinery while taking this medication.  Wear the splint daily to help with pain.  Follow-up with orthopedics as advised at prior ED visit.  I have provided information for primary care provider in [facility] for you to establish care with.  Get help right away if you: Lose consciousness or have trouble moving after a fall. Have a fall that causes a head injury.
